# Patient Record
Sex: Male | Born: 1970 | Race: White | Hispanic: No | Marital: Married | Smoking: Never smoker
Health system: Southern US, Community
[De-identification: ages and names within clinical notes are randomized; demographics above are authoritative.]

## PROBLEM LIST (undated history)

## (undated) DIAGNOSIS — K120 Recurrent oral aphthae: Secondary | ICD-10-CM

## (undated) DIAGNOSIS — E7800 Pure hypercholesterolemia, unspecified: Secondary | ICD-10-CM

## (undated) DIAGNOSIS — K219 Gastro-esophageal reflux disease without esophagitis: Secondary | ICD-10-CM

## (undated) DIAGNOSIS — R7989 Other specified abnormal findings of blood chemistry: Secondary | ICD-10-CM

## (undated) HISTORY — DX: Pure hypercholesterolemia, unspecified: E78.00

## (undated) HISTORY — DX: Other specified abnormal findings of blood chemistry: R79.89

## (undated) HISTORY — PX: WISDOM TOOTH EXTRACTION: SHX21

---

## 2009-02-20 ENCOUNTER — Encounter: Admission: RE | Admit: 2009-02-20 | Discharge: 2009-02-20 | Payer: Self-pay | Admitting: Family Medicine

## 2010-03-17 ENCOUNTER — Encounter: Payer: Self-pay | Admitting: Urology

## 2011-12-19 ENCOUNTER — Other Ambulatory Visit: Payer: Self-pay | Admitting: Urology

## 2011-12-31 ENCOUNTER — Encounter (HOSPITAL_BASED_OUTPATIENT_CLINIC_OR_DEPARTMENT_OTHER): Payer: Self-pay | Admitting: *Deleted

## 2011-12-31 NOTE — Progress Notes (Signed)
NPO AFTER MN. ARRIVES AT 0930. NEEDS HG. 

## 2012-01-02 ENCOUNTER — Ambulatory Visit (HOSPITAL_BASED_OUTPATIENT_CLINIC_OR_DEPARTMENT_OTHER): Payer: PRIVATE HEALTH INSURANCE | Admitting: Anesthesiology

## 2012-01-02 ENCOUNTER — Encounter (HOSPITAL_BASED_OUTPATIENT_CLINIC_OR_DEPARTMENT_OTHER): Payer: Self-pay | Admitting: Anesthesiology

## 2012-01-02 ENCOUNTER — Ambulatory Visit (HOSPITAL_BASED_OUTPATIENT_CLINIC_OR_DEPARTMENT_OTHER)
Admission: RE | Admit: 2012-01-02 | Discharge: 2012-01-02 | Disposition: A | Discharge status: Home / Self Care | Payer: PRIVATE HEALTH INSURANCE | Source: Ambulatory Visit | Attending: Urology | Admitting: Urology

## 2012-01-02 ENCOUNTER — Encounter (HOSPITAL_BASED_OUTPATIENT_CLINIC_OR_DEPARTMENT_OTHER)
Admission: RE | Disposition: A | Discharge status: Home / Self Care | Payer: Self-pay | Source: Ambulatory Visit | Attending: Urology

## 2012-01-02 ENCOUNTER — Encounter (HOSPITAL_BASED_OUTPATIENT_CLINIC_OR_DEPARTMENT_OTHER): Payer: Self-pay

## 2012-01-02 DIAGNOSIS — Z302 Encounter for sterilization: Secondary | ICD-10-CM

## 2012-01-02 DIAGNOSIS — K219 Gastro-esophageal reflux disease without esophagitis: Secondary | ICD-10-CM | POA: Insufficient documentation

## 2012-01-02 HISTORY — DX: Recurrent oral aphthae: K12.0

## 2012-01-02 HISTORY — PX: VASECTOMY: SHX75

## 2012-01-02 HISTORY — DX: Gastro-esophageal reflux disease without esophagitis: K21.9

## 2012-01-02 SURGERY — VASECTOMY
Anesthesia: General | Site: Scrotum | Laterality: Bilateral | Wound class: Clean

## 2012-01-02 MED ORDER — KETOROLAC TROMETHAMINE 30 MG/ML IJ SOLN
INTRAMUSCULAR | Status: DC | PRN
  Administered 2012-01-02: 30 mg via INTRAVENOUS

## 2012-01-02 MED ORDER — PROPOFOL 10 MG/ML IV BOLUS
INTRAVENOUS | Status: DC | PRN
  Administered 2012-01-02: 300 mg via INTRAVENOUS

## 2012-01-02 MED ORDER — BUPIVACAINE HCL (PF) 0.25 % IJ SOLN
INTRAMUSCULAR | Status: DC | PRN
  Administered 2012-01-02: 10 mL

## 2012-01-02 MED ORDER — MIDAZOLAM HCL 5 MG/5ML IJ SOLN
INTRAMUSCULAR | Status: DC | PRN
  Administered 2012-01-02: 2 mg via INTRAVENOUS

## 2012-01-02 MED ORDER — TRAMADOL-ACETAMINOPHEN 37.5-325 MG PO TABS: 1.0000 | ORAL_TABLET | Freq: Four times a day (QID) | ORAL | Status: DC | PRN

## 2012-01-02 MED ORDER — PROMETHAZINE HCL 25 MG/ML IJ SOLN
6.2500 mg | INTRAMUSCULAR | Status: DC | PRN
  Filled 2012-01-02: qty 1

## 2012-01-02 MED ORDER — ONDANSETRON HCL 4 MG/2ML IJ SOLN
INTRAMUSCULAR | Status: DC | PRN
  Administered 2012-01-02: 4 mg via INTRAVENOUS

## 2012-01-02 MED ORDER — CEFAZOLIN SODIUM-DEXTROSE 2-3 GM-% IV SOLR
2.0000 g | INTRAVENOUS | Status: AC
  Administered 2012-01-02: 2 g via INTRAVENOUS
  Filled 2012-01-02: qty 50

## 2012-01-02 MED ORDER — LACTATED RINGERS IV SOLN
INTRAVENOUS | Status: DC
  Administered 2012-01-02 (×2): via INTRAVENOUS
  Filled 2012-01-02: qty 1000

## 2012-01-02 MED ORDER — LIDOCAINE HCL (CARDIAC) 20 MG/ML IV SOLN
INTRAVENOUS | Status: DC | PRN
  Administered 2012-01-02: 100 mg via INTRAVENOUS

## 2012-01-02 MED ORDER — FENTANYL CITRATE 0.05 MG/ML IJ SOLN
25.0000 ug | INTRAMUSCULAR | Status: DC | PRN
  Filled 2012-01-02: qty 1

## 2012-01-02 MED ORDER — DEXAMETHASONE SODIUM PHOSPHATE 4 MG/ML IJ SOLN
INTRAMUSCULAR | Status: DC | PRN
  Administered 2012-01-02: 10 mg via INTRAVENOUS

## 2012-01-02 MED ORDER — FENTANYL CITRATE 0.05 MG/ML IJ SOLN
INTRAMUSCULAR | Status: DC | PRN
  Administered 2012-01-02: 100 ug via INTRAVENOUS

## 2012-01-02 MED ORDER — CEFAZOLIN SODIUM 1-5 GM-% IV SOLN
1.0000 g | INTRAVENOUS | Status: DC
  Filled 2012-01-02: qty 50

## 2012-01-02 SURGICAL SUPPLY — 35 items
APPLICATOR COTTON TIP 6IN STRL (MISCELLANEOUS) IMPLANT
BANDAGE GAUZE ELAST BULKY 4 IN (GAUZE/BANDAGES/DRESSINGS) ×2 IMPLANT
BLADE SURG 15 STRL LF DISP TIS (BLADE) ×1 IMPLANT
BLADE SURG 15 STRL SS (BLADE) ×1
BLADE SURG ROTATE 9660 (MISCELLANEOUS) IMPLANT
CLEANER CAUTERY TIP 5X5 PAD (MISCELLANEOUS) ×1 IMPLANT
CLOTH BEACON ORANGE TIMEOUT ST (SAFETY) ×2 IMPLANT
COVER MAYO STAND STRL (DRAPES) ×2 IMPLANT
COVER TABLE BACK 60X90 (DRAPES) ×2 IMPLANT
DERMABOND ADVANCED (GAUZE/BANDAGES/DRESSINGS) ×1
DERMABOND ADVANCED .7 DNX12 (GAUZE/BANDAGES/DRESSINGS) ×1 IMPLANT
DRAPE PED LAPAROTOMY (DRAPES) ×2 IMPLANT
ELECT NEEDLE TIP 2.8 STRL (NEEDLE) ×2 IMPLANT
ELECT REM PT RETURN 9FT ADLT (ELECTROSURGICAL) ×2
ELECTRODE REM PT RTRN 9FT ADLT (ELECTROSURGICAL) ×1 IMPLANT
GLOVE BIO SURGEON STRL SZ7.5 (GLOVE) ×2 IMPLANT
GLOVE BIOGEL PI IND STRL 7.5 (GLOVE) ×1 IMPLANT
GLOVE BIOGEL PI INDICATOR 7.5 (GLOVE) ×1
GLOVE ECLIPSE 7.0 STRL STRAW (GLOVE) ×2 IMPLANT
GOWN PREVENTION PLUS XLARGE (GOWN DISPOSABLE) ×2 IMPLANT
GOWN STRL NON-REIN LRG LVL3 (GOWN DISPOSABLE) ×2 IMPLANT
GOWN W/2 COTTON TOWELS 2 STD (GOWNS) IMPLANT
NEEDLE HYPO 25X5/8 SAFETYGLIDE (NEEDLE) ×2 IMPLANT
NS IRRIG 500ML POUR BTL (IV SOLUTION) IMPLANT
PACK BASIN DAY SURGERY FS (CUSTOM PROCEDURE TRAY) ×2 IMPLANT
PAD CLEANER CAUTERY TIP 5X5 (MISCELLANEOUS) ×1
PENCIL BUTTON HOLSTER BLD 10FT (ELECTRODE) ×2 IMPLANT
SPONGE GAUZE 4X4 12PLY (GAUZE/BANDAGES/DRESSINGS) ×2 IMPLANT
SUT MON AB 5-0 PS2 18 (SUTURE) ×2 IMPLANT
SUT VIC AB 3-0 SH 27 (SUTURE) ×2
SUT VIC AB 3-0 SH 27X BRD (SUTURE) ×2 IMPLANT
SYR CONTROL 10ML LL (SYRINGE) ×2 IMPLANT
TOWEL OR 17X24 6PK STRL BLUE (TOWEL DISPOSABLE) ×4 IMPLANT
TRAY DSU PREP LF (CUSTOM PROCEDURE TRAY) ×2 IMPLANT
WATER STERILE IRR 500ML POUR (IV SOLUTION) IMPLANT

## 2012-01-02 NOTE — Op Note (Signed)
Pre-operative diagnosis : Elective sterilization  Postoperative diagnosis:Same  Operation:  Vasectomy  Surgeon:  S. Patsi Sears, MD  First assistant: None  Anesthesia:  epidural, general  Preparation:  After appropriate pre-anesthesia, the patient was brought to the OR and placed upon the OR table in the supine position, where General LMA anesthesia was administered. Arm band was double-checked. The scrotum was prepped with betadine solution, and draped in the usual fashion.   Review history:  41 yo male for vasectomy, with significant anxiety regarding office procedure, and tight scrotum on his physical exam, now for vasectomy under anesthesia.   Statement of  Likelihood of Success: Excellent. TIME-OUT observed.:  Procedure: Scrotal exam shows tight scrotum, even with anesthesia. The vas is identified bilaterally, however, and a 0.5cm incision made, and the vas isolated, and double-clamped. A portion of the vas was then removed using the electorsugical unit, and each end was ligated with 3-0 Vicryl suture.  The ends were oversewn, and the wounds closed with a single 3-0 Vicryl simple suture.  ).5 marcaine was injected into each wound, and the patient received IV Toradol. He was awakened and taken to the PAR in good condition.

## 2012-01-02 NOTE — H&P (Signed)
  Active Problems Problems  1. Inquiry And Counseling: Family Planning V25.09  History of Present Illness       41 yo married male, last seen 07/29/05, presents today for a vasectomy consult.  He is married to [place], and  has 1 son & 2 daughters.  He would like to consider a more permanent form of birth control.   Past Medical History Problems  1. History of  Anxiety (Symptom) 300.00 2. History of  Depression 311 3. History of  Esophageal Reflux 530.81  Surgical History Problems  1. History of  No Surgical Problems  Current Meds 1. Pantoprazole Sodium 40 MG Oral Tablet Delayed Release; Therapy: 10Sep2013 to  Allergies Medication  1. No Known Drug Allergies  Family History Problems  1. Paternal history of  Cancer 2. Family history of  Family Health Status - Father's Age 16. Family history of  Family Health Status - Mother's Age 10. Family history of  Family Health Status Number Of Children 1 son, 2 daughters 5. Paternal history of  Nephrolithiasis  Social History Problems    Alcohol Use 3 per week   Caffeine Use 1 per day   Marital History - Currently Married   Never A Smoker   Occupation: Pensions consultant  Review of Systems Genitourinary, constitutional, skin, eye, otolaryngeal, hematologic/lymphatic, cardiovascular, pulmonary, endocrine, musculoskeletal, gastrointestinal, neurological and psychiatric system(s) were reviewed and pertinent findings if present are noted.    Vitals Vital Signs [Data Includes: Last 1 Day]  08Oct2013 11:23AM  BMI Calculated: 24.64 BSA Calculated: 1.99 Height: 5 ft 11 in Weight: 176 lb  Blood Pressure: 160 / 97 Heart Rate: 62  Physical Exam Genitourinary: Examination of the penis demonstrates no discharge, no masses, no lesions and a normal meatus. The scrotum is without lesions. The right epididymis is palpably normal and non-tender. The left epididymis is palpably normal and non-tender. The right testis is non-tender and without masses.  The left testis is non-tender and without masses. Tight scrotum.    Assessment Assessed  1. Inquiry And Counseling: Family Planning V7.09   41 yo male for vasectomy. He will have his vas under anesthesia. We have discussed all his alternatives.   Plan Inquiry And Counseling: Family Planning (V25.09)  1. Follow-up PRN Office  Follow-up  Requested for: 08Oct2013   Discuss with his wife. Will call to schedule with general anesthesia.   Signatures Electronically signed by : Jethro Bolus, M.D.; Dec 02 2011 11:48AM

## 2012-01-02 NOTE — Interval H&P Note (Signed)
History and Physical Interval Note:  01/02/2012 10:39 AM  Frederick Herrera  has presented today for surgery, with the diagnosis of DESIRES STERILIZATION  The various methods of treatment have been discussed with the patient and family. After consideration of risks, benefits and other options for treatment, the patient has consented to  Procedure(s) (LRB) with comments: VASECTOMY (Bilateral) as a surgical intervention .  The patient's history has been reviewed, patient examined, no change in status, stable for surgery.  I have reviewed the patient's chart and labs.  Questions were answered to the patient's satisfaction.     Jethro Bolus I

## 2012-01-02 NOTE — Anesthesia Preprocedure Evaluation (Signed)
Anesthesia Evaluation  Patient identified by MRN, date of birth, ID band Patient awake    Reviewed: Allergy & Precautions, H&P , NPO status , Patient's Chart, lab work & pertinent test results, reviewed documented beta blocker date and time   Airway Mallampati: II TM Distance: >3 FB Neck ROM: full    Dental No notable dental hx.    Pulmonary neg pulmonary ROS,  breath sounds clear to auscultation  Pulmonary exam normal       Cardiovascular Exercise Tolerance: Good negative cardio ROS  Rhythm:regular Rate:Normal     Neuro/Psych Depression and anxiety.negative neurological ROS     GI/Hepatic Neg liver ROS, GERD-  Medicated,  Endo/Other  negative endocrine ROS  Renal/GU negative Renal ROS  negative genitourinary   Musculoskeletal   Abdominal   Peds  Hematology negative hematology ROS (+)   Anesthesia Other Findings   Reproductive/Obstetrics negative OB ROS                           Anesthesia Physical Anesthesia Plan  ASA: I  Anesthesia Plan: General LMA   Post-op Pain Management:    Induction:   Airway Management Planned:   Additional Equipment:   Intra-op Plan:   Post-operative Plan:   Informed Consent: I have reviewed the patients History and Physical, chart, labs and discussed the procedure including the risks, benefits and alternatives for the proposed anesthesia with the patient or authorized representative who has indicated his/her understanding and acceptance.   Dental Advisory Given  Plan Discussed with: CRNA  Anesthesia Plan Comments:         Anesthesia Quick Evaluation

## 2012-01-02 NOTE — Anesthesia Postprocedure Evaluation (Signed)
  Anesthesia Post-op Note  Patient: Frederick Herrera  Procedure(s) Performed: Procedure(s) (LRB): VASECTOMY (Bilateral)  Patient Location: PACU  Anesthesia Type: General  Level of Consciousness: awake and alert   Airway and Oxygen Therapy: Patient Spontanous Breathing  Post-op Pain: mild  Post-op Assessment: Post-op Vital signs reviewed, Patient's Cardiovascular Status Stable, Respiratory Function Stable, Patent Airway and No signs of Nausea or vomiting  Post-op Vital Signs: stable  Complications: No apparent anesthesia complications

## 2012-01-02 NOTE — Transfer of Care (Signed)
Immediate Anesthesia Transfer of Care Note  Patient: Frederick Herrera  Procedure(s) Performed: Procedure(s) (LRB): VASECTOMY (Bilateral)  Patient Location: PACU  Anesthesia Type: General  Level of Consciousness: awake, oriented, sedated and patient cooperative  Airway & Oxygen Therapy: Patient Spontanous Breathing and Patient connected to face mask oxygen  Post-op Assessment: Report given to PACU RN and Post -op Vital signs reviewed and stable  Post vital signs: Reviewed and stable  Complications: No apparent anesthesia complications

## 2012-01-02 NOTE — Anesthesia Procedure Notes (Signed)
Procedure Name: LMA Insertion Date/Time: 01/02/2012 10:44 AM Performed by: Norva Pavlov Pre-anesthesia Checklist: Patient identified, Emergency Drugs available, Suction available and Patient being monitored Patient Re-evaluated:Patient Re-evaluated prior to inductionOxygen Delivery Method: Circle System Utilized Preoxygenation: Pre-oxygenation with 100% oxygen Intubation Type: IV induction Ventilation: Mask ventilation without difficulty LMA: LMA inserted LMA Size: 4.0 Number of attempts: 1 Airway Equipment and Method: bite block Placement Confirmation: positive ETCO2 Tube secured with: Tape Dental Injury: Teeth and Oropharynx as per pre-operative assessment

## 2012-01-06 ENCOUNTER — Encounter (HOSPITAL_BASED_OUTPATIENT_CLINIC_OR_DEPARTMENT_OTHER): Payer: Self-pay | Admitting: Urology

## 2012-02-05 ENCOUNTER — Encounter (HOSPITAL_COMMUNITY): Payer: Self-pay | Admitting: Emergency Medicine

## 2012-02-05 ENCOUNTER — Emergency Department (HOSPITAL_COMMUNITY)
Admission: EM | Admit: 2012-02-05 | Discharge: 2012-02-05 | Disposition: A | Discharge status: Home / Self Care | Payer: PRIVATE HEALTH INSURANCE | Attending: Emergency Medicine | Admitting: Emergency Medicine

## 2012-02-05 DIAGNOSIS — R509 Fever, unspecified: Secondary | ICD-10-CM | POA: Insufficient documentation

## 2012-02-05 DIAGNOSIS — K219 Gastro-esophageal reflux disease without esophagitis: Secondary | ICD-10-CM | POA: Insufficient documentation

## 2012-02-05 DIAGNOSIS — Z8719 Personal history of other diseases of the digestive system: Secondary | ICD-10-CM | POA: Insufficient documentation

## 2012-02-05 DIAGNOSIS — Z79899 Other long term (current) drug therapy: Secondary | ICD-10-CM | POA: Insufficient documentation

## 2012-02-05 DIAGNOSIS — M542 Cervicalgia: Secondary | ICD-10-CM | POA: Insufficient documentation

## 2012-02-05 DIAGNOSIS — R51 Headache: Secondary | ICD-10-CM | POA: Insufficient documentation

## 2012-02-05 DIAGNOSIS — IMO0001 Reserved for inherently not codable concepts without codable children: Secondary | ICD-10-CM | POA: Insufficient documentation

## 2012-02-05 LAB — CBC WITH DIFFERENTIAL/PLATELET
Basophils Absolute: 0.1 10*3/uL (ref 0.0–0.1)
Basophils Relative: 1 % (ref 0–1)
Eosinophils Absolute: 0.1 10*3/uL (ref 0.0–0.7)
Eosinophils Relative: 1 % (ref 0–5)
HCT: 39.4 % (ref 39.0–52.0)
Hemoglobin: 13.9 g/dL (ref 13.0–17.0)
Lymphocytes Relative: 32 % (ref 12–46)
Lymphs Abs: 2.1 10*3/uL (ref 0.7–4.0)
MCH: 29.7 pg (ref 26.0–34.0)
MCHC: 35.3 g/dL (ref 30.0–36.0)
MCV: 84.2 fL (ref 78.0–100.0)
Monocytes Absolute: 0.4 10*3/uL (ref 0.1–1.0)
Monocytes Relative: 6 % (ref 3–12)
Neutro Abs: 3.8 10*3/uL (ref 1.7–7.7)
Neutrophils Relative %: 60 % (ref 43–77)
Platelets: 252 10*3/uL (ref 150–400)
RBC: 4.68 MIL/uL (ref 4.22–5.81)
RDW: 12.7 % (ref 11.5–15.5)
WBC: 6.4 10*3/uL (ref 4.0–10.5)

## 2012-02-05 LAB — BASIC METABOLIC PANEL
BUN: 8 mg/dL (ref 6–23)
CO2: 28 mEq/L (ref 19–32)
Calcium: 9.4 mg/dL (ref 8.4–10.5)
Chloride: 101 mEq/L (ref 96–112)
Creatinine, Ser: 0.68 mg/dL (ref 0.50–1.35)
GFR calc Af Amer: >90 mL/min (ref >90)
GFR calc non Af Amer: >90 mL/min (ref >90)
Glucose, Bld: 100 mg/dL — ABNORMAL HIGH (ref 70–99)
Potassium: 4 mEq/L (ref 3.5–5.1)
Sodium: 140 mEq/L (ref 135–145)

## 2012-02-05 NOTE — ED Notes (Signed)
Pt c/o flu like illness with fever that is mostly resolved but still having HA and neck pain; pt sts taking tylenol and motrin for fever; pt denies photophobia or sweating at present; pt sent here by PCP to r/o meningitis

## 2012-02-05 NOTE — ED Provider Notes (Signed)
History     CSN: [id number]  Arrival date & time 02/05/12  1321   First MD Initiated Contact with Patient 02/05/12 1620      Chief Complaint  Patient presents with  . Headache  . Generalized Body Aches  . Neck Pain    (Consider location/radiation/quality/duration/timing/severity/associated sxs/prior treatment) Patient is a 41 y.o. male presenting with headaches and neck pain. The history is provided by the patient and the spouse.  Headache  This is a new problem. The current episode started more than 2 days ago. The problem occurs every few hours. The problem has been gradually improving. The headache is associated with nothing. Pain location: Diffuse. The quality of the pain is described as dull and throbbing. The pain is moderate. The pain does not radiate. Associated symptoms include a fever. Pertinent negatives include no anorexia, no malaise/fatigue, no shortness of breath, no nausea and no vomiting. Associated symptoms comments: Myalgias for duration of the illness, left-sided neck pain that began today.Marland Kitchen He has tried acetaminophen and NSAIDs for the symptoms. The treatment provided moderate relief.  Neck Pain  Associated symptoms include headaches. Pertinent negatives include no chest pain.    Past Medical History  Diagnosis Date  . GERD (gastroesophageal reflux disease)   . 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 Wisdom tooth extraction AGE 43    ORAL SURGEON OFFICE (POST N/V)  . Vasectomy 01/02/2012    Procedure: VASECTOMY;  Surgeon: Kathi Ludwig, MD;  Location: [facility];  Service: Urology;  Laterality: Bilateral;    History reviewed. No pertinent family history.  History  Substance Use Topics  . Smoking status: Never Smoker   . Smokeless tobacco: Never Used  . Alcohol Use: Yes     Comment: OCCASIONAL      Review of Systems  Constitutional: Positive for fever. Negative for chills and  malaise/fatigue.  HENT: Positive for neck pain. Negative for sneezing.   Respiratory: Negative for cough and shortness of breath.   Cardiovascular: Negative for chest pain and leg swelling.  Gastrointestinal: Negative for nausea, vomiting and anorexia.  Neurological: Positive for headaches.  All other systems reviewed and are negative.    Allergies  Review of patient's allergies indicates no known allergies.  Home Medications   Current Outpatient Rx  Name  Route  Sig  Dispense  Refill  . ACETAMINOPHEN 500 MG PO TABS   Oral   Take 500 mg by mouth every 6 (six) hours as needed. For pain         . IBUPROFEN 200 MG PO TABS   Oral   Take 800 mg by mouth every 6 (six) hours as needed. For pain         . PANTOPRAZOLE SODIUM 40 MG PO TBEC   Oral   Take 40 mg by mouth every evening.           BP 159/97  Pulse 75  Temp 98.8 F (37.1 C) (Oral)  Resp 18  SpO2 98%  Physical Exam  Nursing note and vitals reviewed. Constitutional: He is oriented to person, place, and time. He appears well-developed and well-nourished. No distress.  HENT:  Head: Normocephalic and atraumatic.  Mouth/Throat: No oropharyngeal exudate.  Eyes: EOM are normal. Pupils are equal, round, and reactive to light.  Neck: Normal range of motion. Neck supple. No tracheal deviation present. No thyromegaly present.       No meningeal signs  Cardiovascular: Normal rate and regular rhythm.  Exam reveals no friction rub.   No murmur heard. Pulmonary/Chest: Effort normal and breath sounds normal. No respiratory distress. He has no wheezes. He has no rales.  Abdominal: He exhibits no distension. There is no tenderness. There is no rebound.  Musculoskeletal: Normal range of motion. He exhibits no edema.  Lymphadenopathy:    He has no cervical adenopathy.  Neurological: He is alert and oriented to person, place, and time. No cranial nerve deficit. He exhibits normal muscle tone. Coordination normal.  Skin: He is  not diaphoretic.    ED Course  Procedures (including critical care time)  Labs Reviewed  BASIC METABOLIC PANEL - Abnormal; Notable for the following:    Glucose, Bld 100 (*)     All other components within normal limits  CBC WITH DIFFERENTIAL   No results found.   1. Headache   2. Fever       MDM  Patient is a 41 year old male with history of GERD who presents with headache and neck pain. He has had headaches for the past 6 days with associated fevers. Neck pain started earlier today. The patient presented to his PCP earlier in the week he recommended supportive care with NSAIDs and Tylenol for his fever and headache. He call his physician today and reported neck pain. The physician recommended to come here for a rule out of meningitis. Patient states his neck pain is on the left side it feels like a pulled muscle. Patient easily ambulatory to the department. He is nontoxic appearing. He is afebrile and without headache at this time. He states he's been using Motrin and Tylenol around-the-clock for fever suppression for headache control. Patient has no signs of meningismus. He is moving all extremities well and has no cranial nerve deficits. My clinical suspicion for meningitis is very low. His symptoms are improving. His symptoms of the present for 6 days. He also appears her well and is not appear to clinically have meningitis. Dr. Juleen China and I spent some time counseling the patient and offered an LP is feeling definitive means of ruling out meningitis. The patient declined an LP at this time. Labs are normal. Patient stable for discharge.    Elwin Mocha, MD 02/05/12 [phone]

## 2012-02-05 NOTE — ED Provider Notes (Signed)
I saw and evaluated the patient, reviewed the resident's note and I agree with the findings and plan.  41 year old male with persistent headache and neck pain. Onset about 6 days ago. Flulike symptoms initially which have improved although the headache and neck pain has persisted. No neurological complaints. On exam patient is well appearing. There is no nuchal rigidity. Cranial nerves II through XII are intact. Pupils are round and equally reactive to light. Strength is 5 out of 5 bilateral upper lower extremities. Speech clear and content is appropriate. Good finger nose testing bilaterally. Very low suspicion for meningitis or other potential emergent etiology of his symptoms such as bleed, heart or vertebral artery dissection, carbon monoxide poisoning or ocular etiology. Headache and neck pain may be lingering symptoms from his recent illness. Discussed with patient's significant other benefits of lumbar puncture. He is deferring. I feel this is reasonable. Emergent return cautions were discussed. Plan continued symptomatic treatment otherwise.  Raeford Razor, MD 02/05/12 1840

## 2015-10-30 DIAGNOSIS — D2262 Melanocytic nevi of left upper limb, including shoulder: Secondary | ICD-10-CM | POA: Diagnosis not present

## 2015-10-30 DIAGNOSIS — L821 Other seborrheic keratosis: Secondary | ICD-10-CM | POA: Diagnosis not present

## 2015-10-30 DIAGNOSIS — D2222 Melanocytic nevi of left ear and external auricular canal: Secondary | ICD-10-CM | POA: Diagnosis not present

## 2015-10-30 DIAGNOSIS — D224 Melanocytic nevi of scalp and neck: Secondary | ICD-10-CM | POA: Diagnosis not present

## 2015-11-20 DIAGNOSIS — Z23 Encounter for immunization: Secondary | ICD-10-CM | POA: Diagnosis not present

## 2015-11-27 DIAGNOSIS — Z0100 Encounter for examination of eyes and vision without abnormal findings: Secondary | ICD-10-CM | POA: Diagnosis not present

## 2016-01-09 DIAGNOSIS — R55 Syncope and collapse: Secondary | ICD-10-CM | POA: Diagnosis not present

## 2016-01-15 DIAGNOSIS — R42 Dizziness and giddiness: Secondary | ICD-10-CM | POA: Diagnosis not present

## 2016-01-15 DIAGNOSIS — R030 Elevated blood-pressure reading, without diagnosis of hypertension: Secondary | ICD-10-CM | POA: Diagnosis not present

## 2016-04-01 DIAGNOSIS — R55 Syncope and collapse: Secondary | ICD-10-CM | POA: Diagnosis not present

## 2016-04-01 DIAGNOSIS — R42 Dizziness and giddiness: Secondary | ICD-10-CM | POA: Diagnosis not present

## 2016-04-01 DIAGNOSIS — I10 Essential (primary) hypertension: Secondary | ICD-10-CM | POA: Diagnosis not present

## 2016-04-07 ENCOUNTER — Telehealth: Payer: Self-pay

## 2016-04-07 NOTE — Telephone Encounter (Signed)
SENT NOTES TO SCHEDULING 

## 2016-04-09 DIAGNOSIS — R42 Dizziness and giddiness: Secondary | ICD-10-CM | POA: Diagnosis not present

## 2016-04-11 ENCOUNTER — Encounter: Payer: Self-pay | Admitting: Internal Medicine

## 2016-04-11 ENCOUNTER — Other Ambulatory Visit: Payer: Self-pay

## 2016-04-11 ENCOUNTER — Ambulatory Visit (INDEPENDENT_AMBULATORY_CARE_PROVIDER_SITE_OTHER): Payer: BLUE CROSS/BLUE SHIELD | Admitting: Internal Medicine

## 2016-04-11 VITALS — BP 142/90 | HR 70 | Ht 71.0 in | Wt 177.2 lb

## 2016-04-11 DIAGNOSIS — R55 Syncope and collapse: Secondary | ICD-10-CM

## 2016-04-11 DIAGNOSIS — R42 Dizziness and giddiness: Secondary | ICD-10-CM

## 2016-04-11 NOTE — Patient Instructions (Addendum)
Medication Instructions:  Your physician recommends that you continue on your current medications as directed. Please refer to the Current Medication list given to you today.   Labwork: None Ordered    Testing/Procedures: None Ordered   Follow-Up: Your physician recommends that you schedule a follow-up appointment in: 6 weeks with Dr. Ladona Ridgelaylor   Any Other Special Instructions Will Be Listed Below (If Applicable). Obtain and record blood pressure at least 2 times a week. Bring recordings to next visit.     If you need a refill on your cardiac medications before your next appointment, please call your pharmacy.

## 2016-04-11 NOTE — Progress Notes (Signed)
HPI [name] is referred today for evaluation of dizzy spells and one episode of syncope. He is an otherwise healthy man who passed about 3 months ago while using the bathroom. He got dizzy, stood up and passed out. He did not lose continence and did not bite his tongue. He has had additional episodes of dizzy. He is not sure if they are related. 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 Eventually the episode stopped after he left the large room where he was speaking. He has never lost continence. He notes that he has recently increased his caffeine intake. His blood pressure has also gone up. He does not have chest pain or sob or edema. No change in physical activity. Interestingly, as a child, he had several episodes of frank syncope and his daughter has had 2 episodes of syncope. No Known Allergies   Current Outpatient Prescriptions  Medication Sig Dispense Refill  . pantoprazole (PROTONIX) 20 MG tablet Take 20 mg by mouth daily.  2  . acetaminophen (TYLENOL) 500 MG tablet Take 1,000 mg by mouth every 6 (six) hours as needed. For pain     . ALPRAZolam (XANAX) 0.25 MG tablet Take 0.25 mg by mouth as directed. As needed    . bismuth subsalicylate (PEPTO BISMOL) 262 MG chewable tablet as directed. Take 2 tablets by mouth as needed    . cetirizine (ZYRTEC) 10 MG tablet Take 10 mg by mouth daily as needed for allergies.    Marland Kitchen. ibuprofen (ADVIL,MOTRIN) 200 MG tablet Take 200 mg by mouth every 6 (six) hours as needed. For pain    . loperamide (IMODIUM) 2 MG capsule Take 2 mg by mouth as directed. As needed for diarrhea or loose stools    . meclizine (ANTIVERT) 25 MG tablet Take 1 tablet by mouth 2 (two) times daily as needed for dizziness.  0   No current facility-administered medications for this visit.      Past Medical History:  Diagnosis Date  . Elevated cholesterol    MILD  . GERD (gastroesophageal reflux disease)   . Low  testosterone level in male    resolved  . 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   Past Surgical History:  Procedure Laterality Date  . VASECTOMY  01/02/2012   Procedure: VASECTOMY;  Surgeon: Frederick LudwigSigmund I Tannenbaum, MD;  [facility];  Service: Urology;  Laterality: Bilateral;  . WISDOM TOOTH EXTRACTION  AGE 35   ORAL SURGEON OFFICE (POST N/V)     No family history on file.   Social History   Social History  . Marital status: Married    Spouse name: N/A  . Number of children: N/A  . Years of education: N/A   Occupational History  . Not on file.   Social History Main Topics  . Smoking status: Never Smoker  . Smokeless tobacco: Never Used  . Alcohol use Yes     Comment: OCCASIONAL  . Drug use: No  . Sexual activity: Not on file   Other Topics Concern  . Not on file   Social History Narrative  . No narrative on file     BP (!) 142/90   Pulse 70   Ht 5\' 11"  (1.803 m)   Wt 177 lb 3.2 oz (80.4 kg)   BMI  24.71 kg/m  Orthostatic vitals - no drop in blood pressure. HR increased from 66 to 80/min.  Physical Exam:  Well appearing 46 yo man, NAD HEENT: Unremarkable Neck:  6 cm JVD, no thyromegally Lymphatics:  No adenopathy Back:  No CVA tenderness Lungs:  Clear with no wheezes HEART:  Regular rate rhythm, no murmurs, no rubs, no clicks Abd:  soft, positive bowel sounds, no organomegally, no rebound, no guarding Ext:  2 plus pulses, no edema, no cyanosis, no clubbing Skin:  No rashes no nodules Neuro:  CN II through XII intact, motor grossly intact  EKG - nsr   DEVICE  Normal device function.  See PaceArt for details.   Assess/Plan: 1. Autonomic dysfunction - he has had a single episode of syncope many months ago. None since although he has episodes of dizziness. He is pending a neuro visit. I have discussed the pathophysiology of autonomic dysfunction.  Because of HTN, I have not recommended increasing his salt intake. I might have him try a low dose beta blocker when I see him again, depending on how he has done. Warnings to lie down if he feels a bad spell were given.  2. HTN - this is a new problem and complicates his treatment. I will consider adding a beta blocker when he returns if his BP is still up.  Frederick Herrera,M.D.  Spent over 60 minutes including 30 minutes of face-face time with the patient.

## 2016-04-14 DIAGNOSIS — R51 Headache: Secondary | ICD-10-CM | POA: Diagnosis not present

## 2016-04-14 DIAGNOSIS — R42 Dizziness and giddiness: Secondary | ICD-10-CM | POA: Diagnosis not present

## 2016-04-14 DIAGNOSIS — G4489 Other headache syndrome: Secondary | ICD-10-CM | POA: Diagnosis not present

## 2016-04-14 DIAGNOSIS — R55 Syncope and collapse: Secondary | ICD-10-CM | POA: Diagnosis not present

## 2016-04-16 ENCOUNTER — Ambulatory Visit: Payer: PRIVATE HEALTH INSURANCE | Admitting: Cardiology

## 2016-04-23 ENCOUNTER — Other Ambulatory Visit: Payer: Self-pay | Admitting: Neurology

## 2016-04-23 DIAGNOSIS — R42 Dizziness and giddiness: Secondary | ICD-10-CM

## 2016-04-25 ENCOUNTER — Ambulatory Visit
Admission: RE | Admit: 2016-04-25 | Discharge: 2016-04-25 | Disposition: A | Discharge status: Home / Self Care | Payer: BLUE CROSS/BLUE SHIELD | Source: Ambulatory Visit | Attending: Neurology | Admitting: Neurology

## 2016-04-25 DIAGNOSIS — R42 Dizziness and giddiness: Secondary | ICD-10-CM

## 2016-04-25 DIAGNOSIS — R51 Headache: Secondary | ICD-10-CM | POA: Diagnosis not present

## 2016-04-29 DIAGNOSIS — F411 Generalized anxiety disorder: Secondary | ICD-10-CM | POA: Diagnosis not present

## 2016-05-06 DIAGNOSIS — H0011 Chalazion right upper eyelid: Secondary | ICD-10-CM | POA: Diagnosis not present

## 2016-05-20 DIAGNOSIS — H0011 Chalazion right upper eyelid: Secondary | ICD-10-CM | POA: Diagnosis not present

## 2016-05-21 ENCOUNTER — Ambulatory Visit: Payer: BLUE CROSS/BLUE SHIELD | Admitting: Internal Medicine

## 2016-06-16 DIAGNOSIS — G44209 Tension-type headache, unspecified, not intractable: Secondary | ICD-10-CM | POA: Diagnosis not present

## 2016-06-16 DIAGNOSIS — I10 Essential (primary) hypertension: Secondary | ICD-10-CM | POA: Diagnosis not present

## 2016-06-16 DIAGNOSIS — F411 Generalized anxiety disorder: Secondary | ICD-10-CM | POA: Diagnosis not present

## 2016-09-04 DIAGNOSIS — L247 Irritant contact dermatitis due to plants, except food: Secondary | ICD-10-CM | POA: Diagnosis not present

## 2016-12-01 DIAGNOSIS — Z23 Encounter for immunization: Secondary | ICD-10-CM | POA: Diagnosis not present

## 2017-01-27 DIAGNOSIS — K219 Gastro-esophageal reflux disease without esophagitis: Secondary | ICD-10-CM | POA: Diagnosis not present

## 2017-01-27 DIAGNOSIS — F411 Generalized anxiety disorder: Secondary | ICD-10-CM | POA: Diagnosis not present

## 2017-01-27 DIAGNOSIS — Z0000 Encounter for general adult medical examination without abnormal findings: Secondary | ICD-10-CM | POA: Diagnosis not present

## 2017-01-29 DIAGNOSIS — Z0000 Encounter for general adult medical examination without abnormal findings: Secondary | ICD-10-CM | POA: Diagnosis not present

## 2017-01-29 DIAGNOSIS — Z13220 Encounter for screening for lipoid disorders: Secondary | ICD-10-CM | POA: Diagnosis not present

## 2017-06-24 DIAGNOSIS — M7712 Lateral epicondylitis, left elbow: Secondary | ICD-10-CM | POA: Diagnosis not present

## 2017-06-24 DIAGNOSIS — H6501 Acute serous otitis media, right ear: Secondary | ICD-10-CM | POA: Diagnosis not present

## 2017-07-06 DIAGNOSIS — M542 Cervicalgia: Secondary | ICD-10-CM | POA: Diagnosis not present

## 2017-11-18 IMAGING — MR MR HEAD W/O CM
8 series · 48 of 48 positions shown · non-contrast
Comparison: None.

CLINICAL DATA: Dizziness and headache

EXAM:
MRI HEAD WITHOUT CONTRAST
TECHNIQUE: Multiplanar, multiecho pulse sequences of the brain and surrounding
structures were obtained without intravenous contrast.

[Series 2: T1 · sagittal · 5.0mm · 0.45mm/px · 2 of 21 slices shown]
[im 1/21]
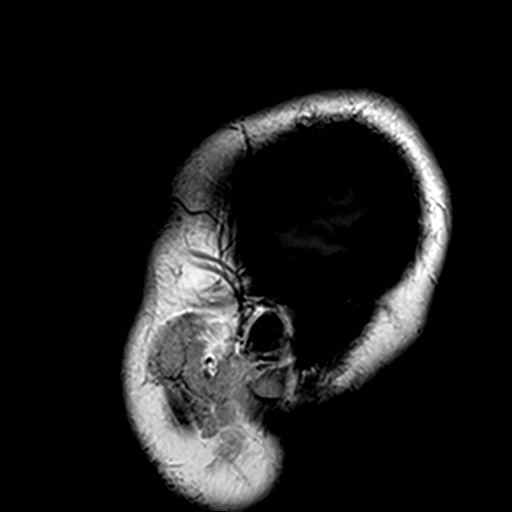
[im 21/21]
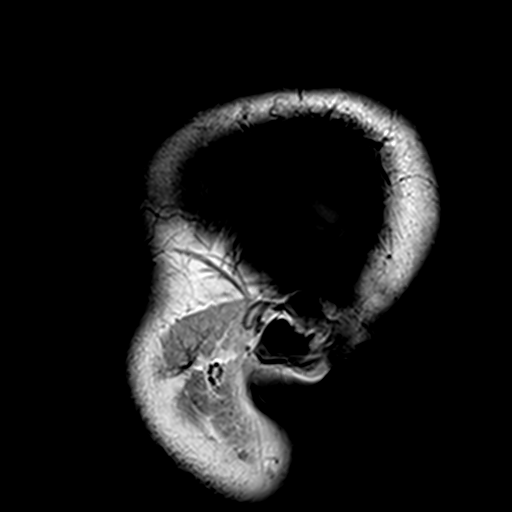

[Series 3: DWI · axial · 3.0mm · 1.80mm/px · z∈[+32,+179]mm · 10 of 100 slices shown (1 of 2)]
[im 1/100]
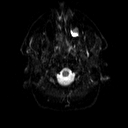
[im 12/100]
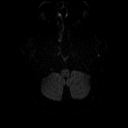
[im 23/100]
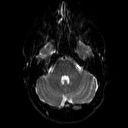
[im 34/100]
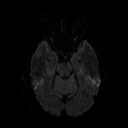
[im 45/100]
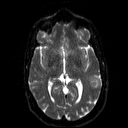
[im 56/100]
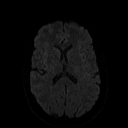
[im 67/100]
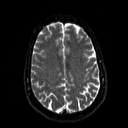
[im 78/100]
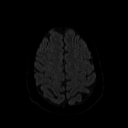
[im 89/100]
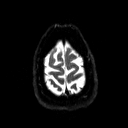
[im 100/100]
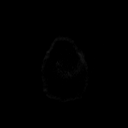

[Series 4: DWI · axial · 3.0mm · 1.80mm/px · z∈[+32,+179]mm · 4 of 46 slices shown (2 of 2)]
[im 1/46]
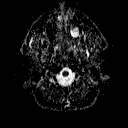
[im 16/46]
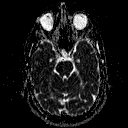
[im 31/46]
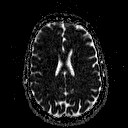
[im 46/46]
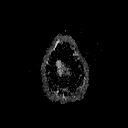

[Series 5: T2 · axial · 5.0mm · 0.51mm/px · z∈[+27,+182]mm · 2 of 24 slices shown (1 of 2)]
[im 1/24]
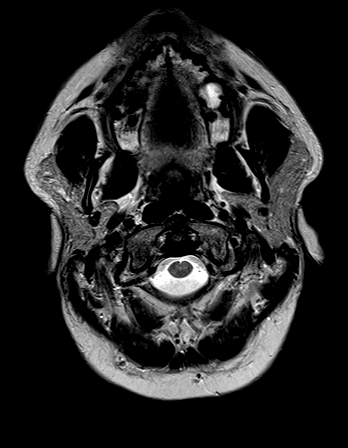
[im 24/24]
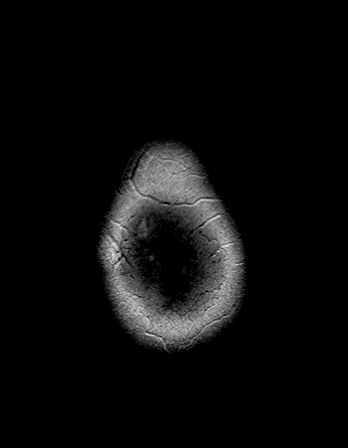

[Series 6: FLAIR · axial · 3.0mm · 0.45mm/px · z∈[+33,+180]mm · 5 of 50 slices shown]
[im 1/50]
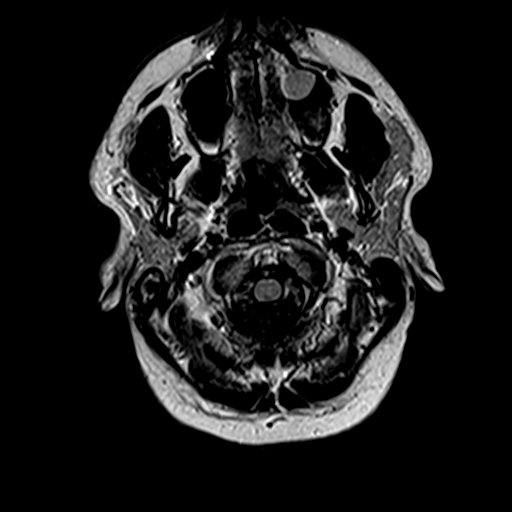
[im 13/50]
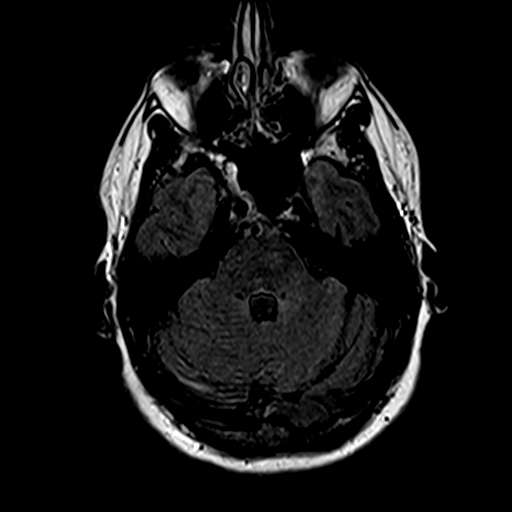
[im 25/50]
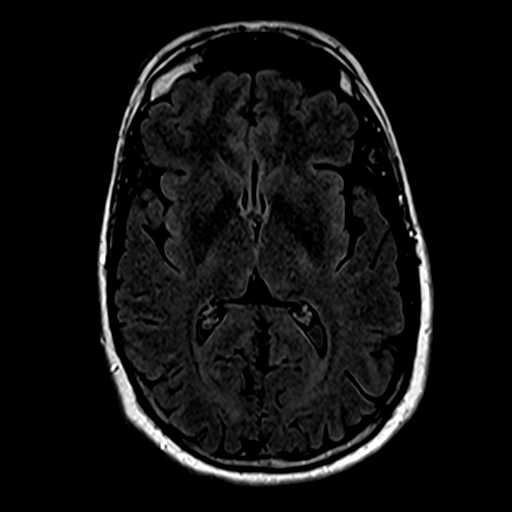
[im 37/50]
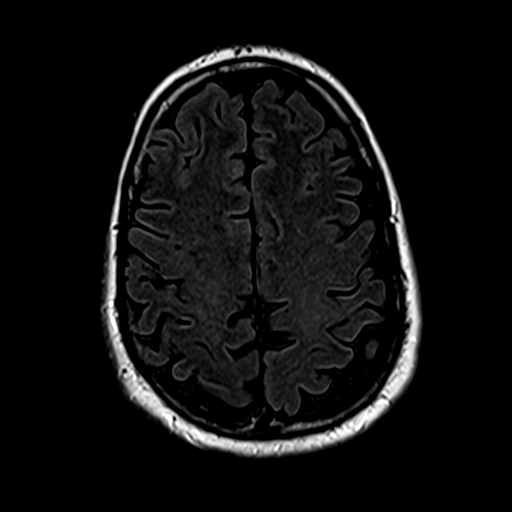
[im 50/50]
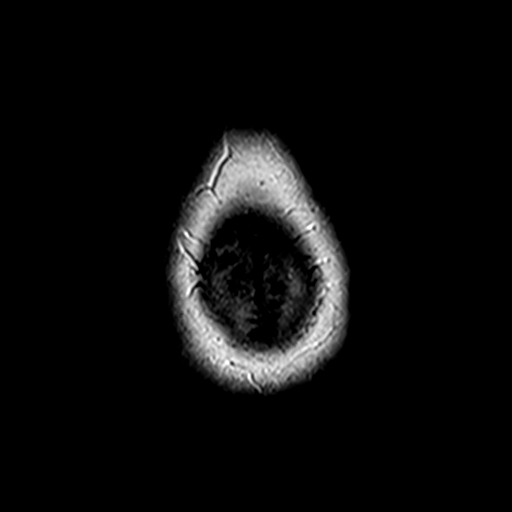

[Series 8: swi_images · axial · 2.0mm · 0.90mm/px · z∈[+20,+178]mm · 8 of 80 slices shown]
[im 1/80]
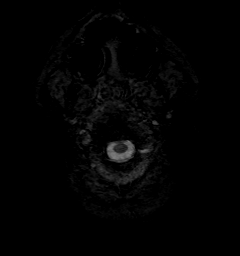
[im 12/80]
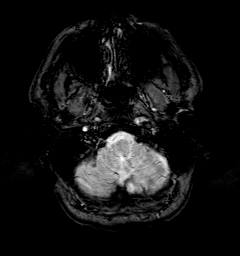
[im 23/80]
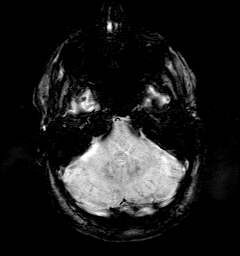
[im 34/80]
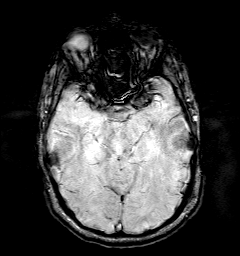
[im 46/80]
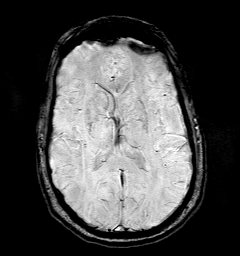
[im 57/80]
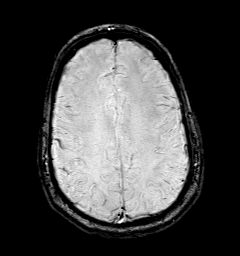
[im 68/80]
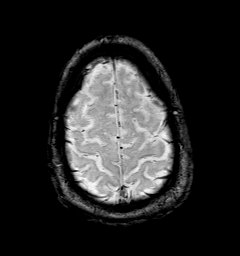
[im 80/80]
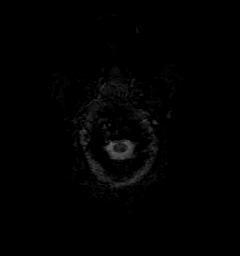

[Series 9: t1_mpr_tra · axial · 1.1mm · 0.75mm/px · z∈[+25,+178]mm · 14 of 144 slices shown]
[im 1/144]
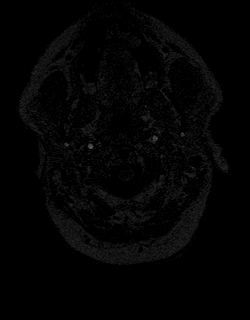
[im 12/144]
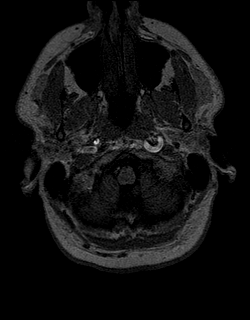
[im 23/144]
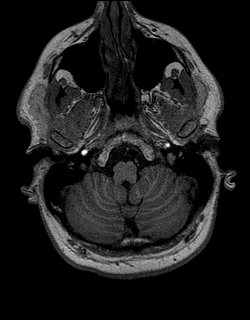
[im 34/144]
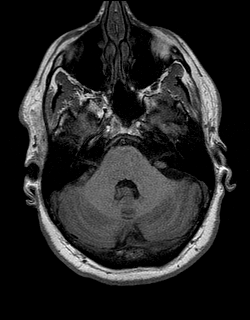
[im 45/144]
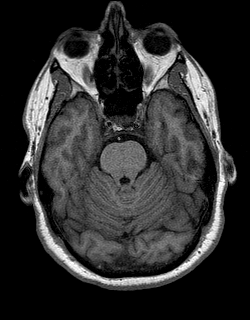
[im 56/144]
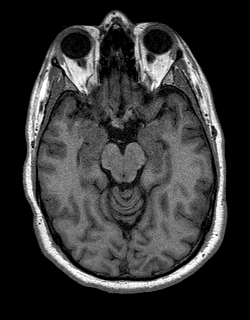
[im 67/144]
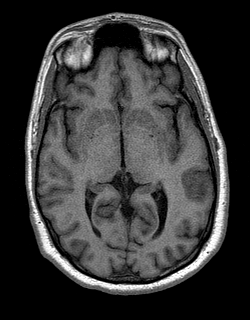
[im 78/144]
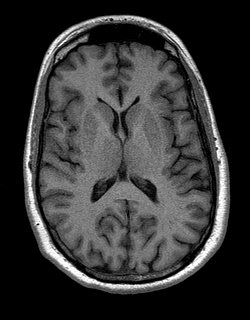
[im 89/144]
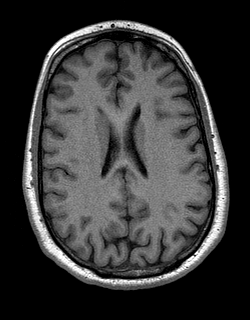
[im 100/144]
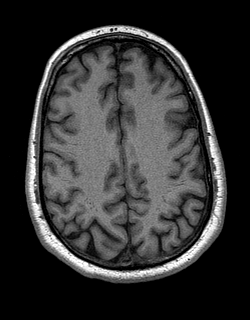
[im 111/144]
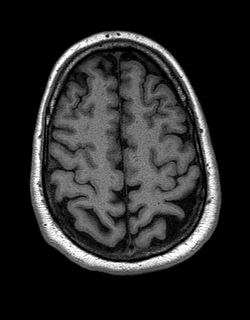
[im 122/144]
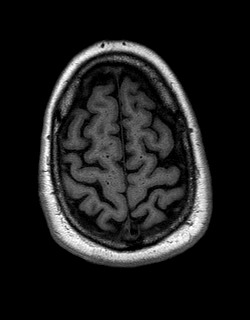
[im 133/144]
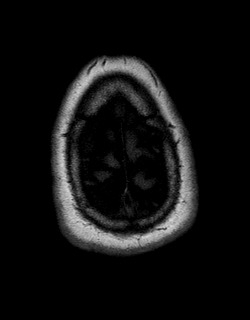
[im 144/144]
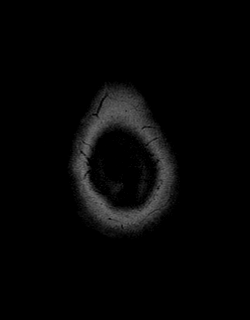

[Series 10: T2 · coronal · 5.0mm · 0.45mm/px · 3 of 30 slices shown (2 of 2)]
[im 1/30]
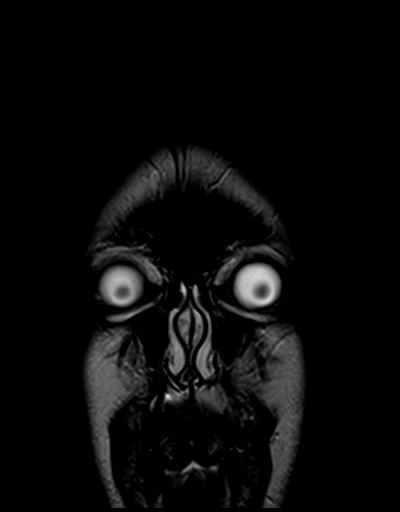
[im 15/30]
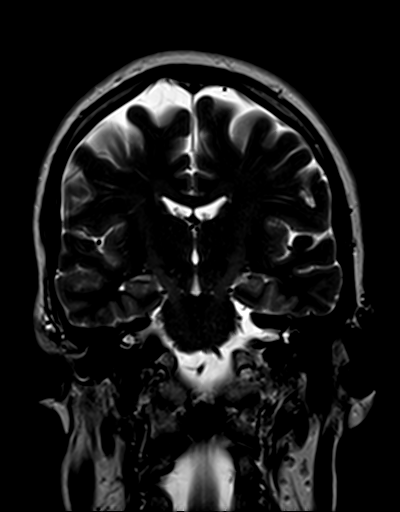
[im 30/30]
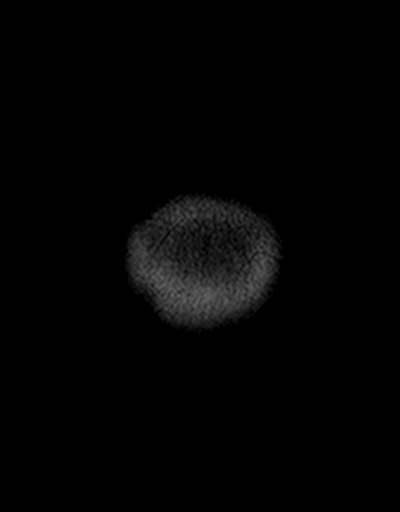

[48 of 48 positions shown; findings below may reference images not displayed]

FINDINGS: Brain: Ventricle size and cerebral volume normal. Negative for acute
or chronic infarction. Negative for demyelinating disease. Negative
for hemorrhage or mass. No edema or shift of the midline structures.

Vascular: Normal arterial flow voids.

Skull and upper cervical spine: Negative

Sinuses/Orbits: Mucous retention cyst left maxillary sinus. Mild
mucosal edema in the ethmoid sinuses. Negative orbit.

Other: None
IMPRESSION: Negative MRI of the brain without contrast.

Mild mucosal edema disease in the paranasal sinuses.

## 2017-12-01 DIAGNOSIS — Z23 Encounter for immunization: Secondary | ICD-10-CM | POA: Diagnosis not present

## 2018-02-09 DIAGNOSIS — Z0000 Encounter for general adult medical examination without abnormal findings: Secondary | ICD-10-CM | POA: Diagnosis not present

## 2018-02-09 DIAGNOSIS — I10 Essential (primary) hypertension: Secondary | ICD-10-CM | POA: Diagnosis not present

## 2018-02-18 DIAGNOSIS — K219 Gastro-esophageal reflux disease without esophagitis: Secondary | ICD-10-CM | POA: Diagnosis not present

## 2018-04-01 DIAGNOSIS — K2210 Ulcer of esophagus without bleeding: Secondary | ICD-10-CM | POA: Diagnosis not present

## 2018-04-01 DIAGNOSIS — K2930 Chronic superficial gastritis without bleeding: Secondary | ICD-10-CM | POA: Diagnosis not present

## 2018-04-01 DIAGNOSIS — K449 Diaphragmatic hernia without obstruction or gangrene: Secondary | ICD-10-CM | POA: Diagnosis not present

## 2018-04-01 DIAGNOSIS — K210 Gastro-esophageal reflux disease with esophagitis: Secondary | ICD-10-CM | POA: Diagnosis not present

## 2018-04-07 DIAGNOSIS — K2930 Chronic superficial gastritis without bleeding: Secondary | ICD-10-CM | POA: Diagnosis not present

## 2018-04-07 DIAGNOSIS — K210 Gastro-esophageal reflux disease with esophagitis: Secondary | ICD-10-CM | POA: Diagnosis not present

## 2018-11-30 DIAGNOSIS — Z23 Encounter for immunization: Secondary | ICD-10-CM | POA: Diagnosis not present

## 2018-12-24 DIAGNOSIS — M25521 Pain in right elbow: Secondary | ICD-10-CM | POA: Diagnosis not present

## 2018-12-28 DIAGNOSIS — M25521 Pain in right elbow: Secondary | ICD-10-CM | POA: Diagnosis not present

## 2018-12-28 DIAGNOSIS — M7711 Lateral epicondylitis, right elbow: Secondary | ICD-10-CM | POA: Diagnosis not present

## 2019-03-08 DIAGNOSIS — Z0000 Encounter for general adult medical examination without abnormal findings: Secondary | ICD-10-CM | POA: Diagnosis not present

## 2019-03-23 DIAGNOSIS — Z131 Encounter for screening for diabetes mellitus: Secondary | ICD-10-CM | POA: Diagnosis not present

## 2019-03-23 DIAGNOSIS — Z13220 Encounter for screening for lipoid disorders: Secondary | ICD-10-CM | POA: Diagnosis not present

## 2019-03-23 DIAGNOSIS — Z1329 Encounter for screening for other suspected endocrine disorder: Secondary | ICD-10-CM | POA: Diagnosis not present

## 2019-03-23 DIAGNOSIS — Z0000 Encounter for general adult medical examination without abnormal findings: Secondary | ICD-10-CM | POA: Diagnosis not present

## 2019-03-25 ENCOUNTER — Ambulatory Visit: Payer: Self-pay | Attending: Internal Medicine

## 2019-03-25 DIAGNOSIS — Z20822 Contact with and (suspected) exposure to covid-19: Secondary | ICD-10-CM | POA: Insufficient documentation

## 2019-03-26 LAB — NOVEL CORONAVIRUS, NAA: SARS-CoV-2, NAA: NOT DETECTED

## 2019-03-28 ENCOUNTER — Other Ambulatory Visit: Payer: BLUE CROSS/BLUE SHIELD

## 2019-03-29 ENCOUNTER — Ambulatory Visit: Payer: Self-pay | Attending: Internal Medicine

## 2019-03-29 ENCOUNTER — Other Ambulatory Visit: Payer: Self-pay

## 2019-03-29 DIAGNOSIS — Z20822 Contact with and (suspected) exposure to covid-19: Secondary | ICD-10-CM | POA: Insufficient documentation

## 2019-03-30 LAB — NOVEL CORONAVIRUS, NAA: SARS-CoV-2, NAA: NOT DETECTED

## 2019-08-04 DIAGNOSIS — Z20822 Contact with and (suspected) exposure to covid-19: Secondary | ICD-10-CM | POA: Diagnosis not present

## 2019-10-22 DIAGNOSIS — Z20822 Contact with and (suspected) exposure to covid-19: Secondary | ICD-10-CM | POA: Diagnosis not present

## 2019-12-02 DIAGNOSIS — Z23 Encounter for immunization: Secondary | ICD-10-CM | POA: Diagnosis not present

## 2020-07-03 DIAGNOSIS — R059 Cough, unspecified: Secondary | ICD-10-CM | POA: Diagnosis not present

## 2020-07-03 DIAGNOSIS — Z20828 Contact with and (suspected) exposure to other viral communicable diseases: Secondary | ICD-10-CM | POA: Diagnosis not present

## 2020-07-03 DIAGNOSIS — Z20822 Contact with and (suspected) exposure to covid-19: Secondary | ICD-10-CM | POA: Diagnosis not present

## 2020-07-04 DIAGNOSIS — Z20828 Contact with and (suspected) exposure to other viral communicable diseases: Secondary | ICD-10-CM | POA: Diagnosis not present

## 2020-07-04 DIAGNOSIS — R059 Cough, unspecified: Secondary | ICD-10-CM | POA: Diagnosis not present

## 2020-07-07 ENCOUNTER — Emergency Department (HOSPITAL_BASED_OUTPATIENT_CLINIC_OR_DEPARTMENT_OTHER)
Admission: EM | Admit: 2020-07-07 | Discharge: 2020-07-07 | Disposition: A | Discharge status: Home / Self Care | Payer: BC Managed Care – PPO | Attending: Emergency Medicine | Admitting: Emergency Medicine

## 2020-07-07 ENCOUNTER — Encounter (HOSPITAL_BASED_OUTPATIENT_CLINIC_OR_DEPARTMENT_OTHER): Payer: Self-pay | Admitting: Emergency Medicine

## 2020-07-07 ENCOUNTER — Other Ambulatory Visit: Payer: Self-pay

## 2020-07-07 DIAGNOSIS — R42 Dizziness and giddiness: Secondary | ICD-10-CM | POA: Diagnosis not present

## 2020-07-07 DIAGNOSIS — R112 Nausea with vomiting, unspecified: Secondary | ICD-10-CM | POA: Diagnosis not present

## 2020-07-07 DIAGNOSIS — R030 Elevated blood-pressure reading, without diagnosis of hypertension: Secondary | ICD-10-CM

## 2020-07-07 DIAGNOSIS — R059 Cough, unspecified: Secondary | ICD-10-CM | POA: Diagnosis not present

## 2020-07-07 DIAGNOSIS — U071 COVID-19: Secondary | ICD-10-CM | POA: Diagnosis not present

## 2020-07-07 LAB — CBC
HCT: 42.4 % (ref 39.0–52.0)
Hemoglobin: 14.7 g/dL (ref 13.0–17.0)
MCH: 29.6 pg (ref 26.0–34.0)
MCHC: 34.7 g/dL (ref 30.0–36.0)
MCV: 85.3 fL (ref 80.0–100.0)
Platelets: 173 10*3/uL (ref 150–400)
RBC: 4.97 MIL/uL (ref 4.22–5.81)
RDW: 12.6 % (ref 11.5–15.5)
WBC: 6.4 10*3/uL (ref 4.0–10.5)
nRBC: 0 % (ref 0.0–0.2)

## 2020-07-07 LAB — COMPREHENSIVE METABOLIC PANEL
ALT: 27 U/L (ref 0–44)
AST: 26 U/L (ref 15–41)
Albumin: 4.3 g/dL (ref 3.5–5.0)
Alkaline Phosphatase: 77 U/L (ref 38–126)
Anion gap: 9 (ref 5–15)
BUN: 11 mg/dL (ref 6–20)
CO2: 25 mmol/L (ref 22–32)
Calcium: 8.5 mg/dL — ABNORMAL LOW (ref 8.9–10.3)
Chloride: 98 mmol/L (ref 98–111)
Creatinine, Ser: 0.72 mg/dL (ref 0.61–1.24)
GFR, Estimated: >60 mL/min (ref >60)
Glucose, Bld: 116 mg/dL — ABNORMAL HIGH (ref 70–99)
Potassium: 4.1 mmol/L (ref 3.5–5.1)
Sodium: 132 mmol/L — ABNORMAL LOW (ref 135–145)
Total Bilirubin: 0.4 mg/dL (ref 0.3–1.2)
Total Protein: 7.1 g/dL (ref 6.5–8.1)

## 2020-07-07 MED ORDER — ONDANSETRON 8 MG PO TBDP: 8.0000 mg | ORAL_TABLET | Freq: Three times a day (TID) | ORAL | 0 refills | Status: AC | PRN

## 2020-07-07 MED ORDER — LORAZEPAM 2 MG/ML IJ SOLN
1.0000 mg | Freq: Once | INTRAMUSCULAR | Status: AC
  Administered 2020-07-07: 1 mg via INTRAVENOUS
  Filled 2020-07-07: qty 1

## 2020-07-07 MED ORDER — METOCLOPRAMIDE HCL 5 MG/ML IJ SOLN
10.0000 mg | Freq: Once | INTRAMUSCULAR | Status: AC
  Administered 2020-07-07: 10 mg via INTRAVENOUS
  Filled 2020-07-07: qty 2

## 2020-07-07 MED ORDER — AMLODIPINE BESYLATE 5 MG PO TABS
5.0000 mg | ORAL_TABLET | Freq: Once | ORAL | Status: AC
  Administered 2020-07-07: 5 mg via ORAL
  Filled 2020-07-07: qty 1

## 2020-07-07 MED ORDER — SODIUM CHLORIDE 0.9 % IV BOLUS
1000.0000 mL | Freq: Once | INTRAVENOUS | Status: AC
  Administered 2020-07-07: 1000 mL via INTRAVENOUS

## 2020-07-07 MED ORDER — AMLODIPINE BESYLATE 5 MG PO TABS: 5.0000 mg | ORAL_TABLET | Freq: Every day | ORAL | 0 refills | Status: AC

## 2020-07-07 MED ORDER — ONDANSETRON HCL 4 MG/2ML IJ SOLN
4.0000 mg | Freq: Once | INTRAMUSCULAR | Status: AC
  Administered 2020-07-07: 4 mg via INTRAVENOUS
  Filled 2020-07-07: qty 2

## 2020-07-07 MED ORDER — ONDANSETRON 4 MG PO TBDP
8.0000 mg | ORAL_TABLET | Freq: Once | ORAL | Status: AC
  Administered 2020-07-07: 8 mg via ORAL
  Filled 2020-07-07: qty 2

## 2020-07-07 MED ORDER — SODIUM CHLORIDE 0.9 % IV BOLUS
500.0000 mL | Freq: Once | INTRAVENOUS | Status: AC
  Administered 2020-07-07: 500 mL via INTRAVENOUS

## 2020-07-07 NOTE — ED Triage Notes (Signed)
Pt had positive covid test May 10. Reports today he is dizzy, blood pressure staying in 180's. Felt like his heart was fluttering. V/d x1 today.

## 2020-07-07 NOTE — ED Provider Notes (Addendum)
[facility] EMERGENCY DEPT Provider Note   CSN: [id number] Arrival date & time: 07/07/20  1555     History Chief Complaint  Patient presents with  . Dizziness    Joud Ingwersen is a 50 y.o. male.  Patient states May 10 tested positive for covid, states initially was asymptomatic (tested as were cases in kids school), but then did develop non prod cough, general malaise, decreased appetite, mild aches. No high fever. No chest pain. No sob. States in past day episode of nausea/vomiting, and loose stool. Emesis not bloody or bilious. No abd pain. States also hx borderline htn and fam hx htn, and noted bp high today. No headache. No change in speech or vision, no numbness/weakness. No neck pain/stiffness.   The history is provided by the patient.  Dizziness Associated symptoms: nausea and vomiting   Associated symptoms: no chest pain, no headaches and no shortness of breath        Past Medical History:  Diagnosis Date  . Elevated cholesterol    MILD  . GERD (gastroesophageal reflux disease)   . Low testosterone level in male    resolved  . 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   Past Surgical History:  Procedure Laterality Date  . VASECTOMY  01/02/2012   Procedure: VASECTOMY;  Surgeon: Kathi Ludwig, MD;  [facility];  Service: Urology;  Laterality: Bilateral;  . WISDOM TOOTH EXTRACTION  AGE 55   ORAL SURGEON OFFICE (POST N/V)       No family history on file.  Social History   Tobacco Use  . Smoking status: Never Smoker  . Smokeless tobacco: Never Used  Substance Use Topics  . Alcohol use: Yes    Comment: OCCASIONAL  . Drug use: No    Home Medications Prior to Admission medications   Medication Sig Start Date End Date Taking? Authorizing Provider  acetaminophen (TYLENOL) 500 MG tablet Take 1,000 mg by mouth every 6 (six) hours as needed. For pain      [provider]  ALPRAZolam (XANAX) 0.25 MG tablet Take 0.25 mg by mouth as directed. As needed    [provider]  bismuth subsalicylate (PEPTO BISMOL) 262 MG chewable tablet as directed. Take 2 tablets by mouth as needed    [provider]  cetirizine (ZYRTEC) 10 MG tablet Take 10 mg by mouth daily as needed for allergies.    [provider]  ibuprofen (ADVIL,MOTRIN) 200 MG tablet Take 200 mg by mouth every 6 (six) hours as needed. For pain    [provider]  loperamide (IMODIUM) 2 MG capsule Take 2 mg by mouth as directed. As needed for diarrhea or loose stools    [provider]  meclizine (ANTIVERT) 25 MG tablet Take 1 tablet by mouth 2 (two) times daily as needed for dizziness. 01/14/16   [provider]  pantoprazole (PROTONIX) 20 MG tablet Take 20 mg by mouth daily. 03/10/16   [provider]    Allergies    Patient has no known allergies.  Review of Systems   Review of Systems  Constitutional: Negative for chills and fever.  HENT: Negative for sore throat.   Eyes: Negative for redness.  Respiratory: Positive for cough. Negative for shortness of breath.   Cardiovascular: Negative for chest pain and leg swelling.  Gastrointestinal: Positive for nausea and vomiting. Negative for abdominal pain.  Genitourinary: Negative for  dysuria and flank pain.  Musculoskeletal: Negative for back pain, neck pain and neck stiffness.  Skin: Negative for rash.  Neurological: Negative for headaches.  Hematological: Does not bruise/bleed easily.  Psychiatric/Behavioral: Negative for confusion.    Physical Exam Updated Vital Signs BP (!) 181/114 Comment: RN made aware; taken x2  Pulse 71   Temp 98.7 F (37.1 C) (Oral)   Resp 18   SpO2 100%   Physical Exam Vitals and nursing note reviewed.  Constitutional:      Appearance: Normal appearance. He is well-developed.  HENT:     Head: Atraumatic.     Nose: Nose normal.      Mouth/Throat:     Mouth: Mucous membranes are moist.     Pharynx: Oropharynx is clear. No oropharyngeal exudate or posterior oropharyngeal erythema.  Eyes:     General: No scleral icterus.    Conjunctiva/sclera: Conjunctivae normal.     Pupils: Pupils are equal, round, and reactive to light.  Neck:     Trachea: No tracheal deviation.  Cardiovascular:     Rate and Rhythm: Normal rate and regular rhythm.     Pulses: Normal pulses.     Heart sounds: Normal heart sounds. No murmur heard. No friction rub. No gallop.   Pulmonary:     Effort: Pulmonary effort is normal. No accessory muscle usage or respiratory distress.     Breath sounds: Normal breath sounds.  Abdominal:     General: There is no distension.     Palpations: Abdomen is soft.     Tenderness: There is no abdominal tenderness.  Genitourinary:    Comments: No cva tenderness. Musculoskeletal:        General: No swelling or tenderness.     Cervical back: Normal range of motion and neck supple. No rigidity.     Right lower leg: No edema.     Left lower leg: No edema.  Skin:    General: Skin is warm and dry.     Findings: No rash.  Neurological:     Mental Status: He is alert.     Comments: Alert, speech clear. Steady gait.   Psychiatric:        Mood and Affect: Mood normal.     ED Results / Procedures / Treatments   Labs (all labs ordered are listed, but only abnormal results are displayed) Results for orders placed or performed during the hospital encounter of 07/07/20  CBC  Result Value Ref Range   WBC 6.4 4.0 - 10.5 K/uL   RBC 4.97 4.22 - 5.81 MIL/uL   Hemoglobin 14.7 13.0 - 17.0 g/dL   HCT 11.9 41.7 - 40.8 %   MCV 85.3 80.0 - 100.0 fL   MCH 29.6 26.0 - 34.0 pg   MCHC 34.7 30.0 - 36.0 g/dL   RDW 14.4 81.8 - 56.3 %   Platelets 173 150 - 400 K/uL   nRBC 0.0 0.0 - 0.2 %  Comprehensive metabolic panel  Result Value Ref Range   Sodium 132 (L) 135 - 145 mmol/L   Potassium 4.1 3.5 - 5.1 mmol/L   Chloride 98  98 - 111 mmol/L   CO2 25 22 - 32 mmol/L   Glucose, Bld 116 (H) 70 - 99 mg/dL   BUN 11 6 - 20 mg/dL   Creatinine, Ser 1.49 0.61 - 1.24 mg/dL   Calcium 8.5 (L) 8.9 - 10.3 mg/dL   Total Protein 7.1 6.5 - 8.1 g/dL   Albumin 4.3 3.5 - 5.0  g/dL   AST 26 15 - 41 U/L   ALT 27 0 - 44 U/L   Alkaline Phosphatase 77 38 - 126 U/L   Total Bilirubin 0.4 0.3 - 1.2 mg/dL   GFR, Estimated >54 >00 mL/min   Anion gap 9 5 - 15    EKG None  Radiology No results found.  Procedures Procedures   Medications Ordered in ED Medications  ondansetron (ZOFRAN-ODT) disintegrating tablet 8 mg (has no administration in time range)    ED Course  I have reviewed the triage vital signs and the nursing notes.  Pertinent labs & imaging results that were available during my care of the patient were reviewed by me and considered in my medical decision making (see chart for details).    MDM Rules/Calculators/A&P                         Labs sent.  Reviewed nursing notes and prior charts for additional history.  Recent covid test positive.   Furious Chiarelli was evaluated in Emergency Department on 07/07/2020 for the symptoms described in the history of present illness. He was evaluated in the context of the global COVID-19 pandemic, which necessitated consideration that the patient might be at risk for infection with the SARS-CoV-2 virus that causes COVID-19. Institutional protocols and algorithms that pertain to the evaluation of patients at risk for COVID-19 are in a state of rapid change based on information released by regulatory bodies including the CDC and federal and state organizations. These policies and algorithms were followed during the patient's care in the ED.  zofran po, po fluids, food. Recheck bp.   Labs reviewed/interpreted by me - wbc normal. Renal fxn normal  On prior office visit, noted with mild elevated bp, and talk then of starting med if remained high.   Several reading high in ED, hx high  blood pressure (although not on meds for same), and fam hx htn - will start amlodipine - rec pcp f/u.  Pt is tolerating po. No nausea/vomiting.   Recheck, notes return of nausea. No abd pain. abd soft non tender. No headache. No cp. No sob. Pt does appear mildly anxious as well, hx same. reglan iv. Ivf. Po. Ativan 1 mg iv (has ride, spouse here, does not have to drive). In emesis bag, no emesis, few cc's saliva noted.      Final Clinical Impression(s) / ED Diagnoses Final diagnoses:  None    Rx / DC Orders ED Discharge Orders    None           Cathren Laine, MD 07/07/20 2037

## 2020-07-07 NOTE — Discharge Instructions (Addendum)
It was our pleasure to provide your ER care today - we hope that you feel better.  Drink plenty of fluids/stay well hydrated. Take zofran as need for nausea.  Your blood pressure is high tonight - limit salt intake, eat heart health meal plan, take blood pressure medication as prescribed, and follow up with your doctor in 1-2 weeks - call office Monday to arrange appointment.   Return to ER if worse, new symptoms, persistent vomiting, new or severe pain, severe abdominal pain, trouble breathing, or other concern.   You were given meds in ED that cause drowsiness - no driving for the next 6 hours.

## 2020-07-07 NOTE — ED Notes (Signed)
Per EDP order, pt given fluids and/or food for PO challenge. Pt verbalized understanding to utilize call bell if nausea or emesis occur. 

## 2020-07-12 DIAGNOSIS — I10 Essential (primary) hypertension: Secondary | ICD-10-CM | POA: Diagnosis not present

## 2020-07-12 DIAGNOSIS — U071 COVID-19: Secondary | ICD-10-CM | POA: Diagnosis not present

## 2020-10-15 DIAGNOSIS — F411 Generalized anxiety disorder: Secondary | ICD-10-CM | POA: Diagnosis not present

## 2020-10-15 DIAGNOSIS — Z0000 Encounter for general adult medical examination without abnormal findings: Secondary | ICD-10-CM | POA: Diagnosis not present

## 2020-10-15 DIAGNOSIS — K219 Gastro-esophageal reflux disease without esophagitis: Secondary | ICD-10-CM | POA: Diagnosis not present

## 2020-10-15 DIAGNOSIS — I10 Essential (primary) hypertension: Secondary | ICD-10-CM | POA: Diagnosis not present

## 2020-10-18 DIAGNOSIS — I10 Essential (primary) hypertension: Secondary | ICD-10-CM | POA: Diagnosis not present

## 2020-10-18 DIAGNOSIS — Z125 Encounter for screening for malignant neoplasm of prostate: Secondary | ICD-10-CM | POA: Diagnosis not present

## 2020-10-31 DIAGNOSIS — H5213 Myopia, bilateral: Secondary | ICD-10-CM | POA: Diagnosis not present

## 2020-10-31 DIAGNOSIS — H04123 Dry eye syndrome of bilateral lacrimal glands: Secondary | ICD-10-CM | POA: Diagnosis not present

## 2020-11-29 DIAGNOSIS — L308 Other specified dermatitis: Secondary | ICD-10-CM | POA: Diagnosis not present

## 2020-11-29 DIAGNOSIS — D2262 Melanocytic nevi of left upper limb, including shoulder: Secondary | ICD-10-CM | POA: Diagnosis not present

## 2020-11-29 DIAGNOSIS — D2261 Melanocytic nevi of right upper limb, including shoulder: Secondary | ICD-10-CM | POA: Diagnosis not present

## 2020-11-29 DIAGNOSIS — D2239 Melanocytic nevi of other parts of face: Secondary | ICD-10-CM | POA: Diagnosis not present

## 2020-12-06 DIAGNOSIS — Z23 Encounter for immunization: Secondary | ICD-10-CM | POA: Diagnosis not present

## 2020-12-13 DIAGNOSIS — R0789 Other chest pain: Secondary | ICD-10-CM | POA: Diagnosis not present

## 2021-01-13 DIAGNOSIS — R112 Nausea with vomiting, unspecified: Secondary | ICD-10-CM | POA: Diagnosis not present

## 2021-01-13 DIAGNOSIS — J101 Influenza due to other identified influenza virus with other respiratory manifestations: Secondary | ICD-10-CM | POA: Diagnosis not present

## 2021-01-13 DIAGNOSIS — Z20822 Contact with and (suspected) exposure to covid-19: Secondary | ICD-10-CM | POA: Diagnosis not present

## 2021-01-13 DIAGNOSIS — R509 Fever, unspecified: Secondary | ICD-10-CM | POA: Diagnosis not present

## 2021-04-16 DIAGNOSIS — Z1211 Encounter for screening for malignant neoplasm of colon: Secondary | ICD-10-CM | POA: Diagnosis not present

## 2021-04-16 DIAGNOSIS — D120 Benign neoplasm of cecum: Secondary | ICD-10-CM | POA: Diagnosis not present

## 2021-06-17 DIAGNOSIS — J029 Acute pharyngitis, unspecified: Secondary | ICD-10-CM | POA: Diagnosis not present

## 2021-06-17 DIAGNOSIS — K120 Recurrent oral aphthae: Secondary | ICD-10-CM | POA: Diagnosis not present

## 2022-01-13 DIAGNOSIS — Z0000 Encounter for general adult medical examination without abnormal findings: Secondary | ICD-10-CM | POA: Diagnosis not present

## 2022-01-13 DIAGNOSIS — I10 Essential (primary) hypertension: Secondary | ICD-10-CM | POA: Diagnosis not present

## 2022-01-13 DIAGNOSIS — F411 Generalized anxiety disorder: Secondary | ICD-10-CM | POA: Diagnosis not present

## 2022-01-13 DIAGNOSIS — K219 Gastro-esophageal reflux disease without esophagitis: Secondary | ICD-10-CM | POA: Diagnosis not present

## 2022-01-21 DIAGNOSIS — Z131 Encounter for screening for diabetes mellitus: Secondary | ICD-10-CM | POA: Diagnosis not present

## 2022-01-21 DIAGNOSIS — Z0000 Encounter for general adult medical examination without abnormal findings: Secondary | ICD-10-CM | POA: Diagnosis not present

## 2022-01-21 DIAGNOSIS — I10 Essential (primary) hypertension: Secondary | ICD-10-CM | POA: Diagnosis not present

## 2022-02-23 DIAGNOSIS — R051 Acute cough: Secondary | ICD-10-CM | POA: Diagnosis not present

## 2022-05-14 DIAGNOSIS — Z79899 Other long term (current) drug therapy: Secondary | ICD-10-CM | POA: Diagnosis not present

## 2022-06-02 DIAGNOSIS — G4719 Other hypersomnia: Secondary | ICD-10-CM | POA: Diagnosis not present

## 2022-06-02 DIAGNOSIS — F411 Generalized anxiety disorder: Secondary | ICD-10-CM | POA: Diagnosis not present

## 2022-06-02 DIAGNOSIS — I10 Essential (primary) hypertension: Secondary | ICD-10-CM | POA: Diagnosis not present

## 2022-06-06 DIAGNOSIS — J3081 Allergic rhinitis due to animal (cat) (dog) hair and dander: Secondary | ICD-10-CM | POA: Diagnosis not present

## 2022-06-06 DIAGNOSIS — J300 Vasomotor rhinitis: Secondary | ICD-10-CM | POA: Diagnosis not present

## 2022-06-06 DIAGNOSIS — J301 Allergic rhinitis due to pollen: Secondary | ICD-10-CM | POA: Diagnosis not present

## 2022-06-06 DIAGNOSIS — H1045 Other chronic allergic conjunctivitis: Secondary | ICD-10-CM | POA: Diagnosis not present

## 2022-06-25 DIAGNOSIS — T781XXA Other adverse food reactions, not elsewhere classified, initial encounter: Secondary | ICD-10-CM | POA: Diagnosis not present

## 2022-07-01 DIAGNOSIS — R200 Anesthesia of skin: Secondary | ICD-10-CM | POA: Diagnosis not present

## 2022-07-01 DIAGNOSIS — G4733 Obstructive sleep apnea (adult) (pediatric): Secondary | ICD-10-CM | POA: Diagnosis not present

## 2022-07-02 DIAGNOSIS — G4733 Obstructive sleep apnea (adult) (pediatric): Secondary | ICD-10-CM | POA: Diagnosis not present

## 2022-07-02 DIAGNOSIS — I10 Essential (primary) hypertension: Secondary | ICD-10-CM | POA: Diagnosis not present

## 2022-07-14 DIAGNOSIS — R200 Anesthesia of skin: Secondary | ICD-10-CM | POA: Diagnosis not present

## 2022-09-02 DIAGNOSIS — E7800 Pure hypercholesterolemia, unspecified: Secondary | ICD-10-CM | POA: Diagnosis not present

## 2022-09-15 ENCOUNTER — Other Ambulatory Visit (HOSPITAL_COMMUNITY): Payer: Self-pay | Admitting: Family Medicine

## 2022-09-15 DIAGNOSIS — E7800 Pure hypercholesterolemia, unspecified: Secondary | ICD-10-CM

## 2022-09-29 ENCOUNTER — Ambulatory Visit (HOSPITAL_COMMUNITY)
Admission: RE | Admit: 2022-09-29 | Discharge: 2022-09-29 | Disposition: A | Discharge status: Home / Self Care | Payer: BC Managed Care – PPO | Source: Ambulatory Visit | Attending: Family Medicine | Admitting: Family Medicine

## 2022-09-29 DIAGNOSIS — E7800 Pure hypercholesterolemia, unspecified: Secondary | ICD-10-CM | POA: Insufficient documentation

## 2022-11-21 DIAGNOSIS — R202 Paresthesia of skin: Secondary | ICD-10-CM | POA: Diagnosis not present

## 2022-11-24 ENCOUNTER — Other Ambulatory Visit: Payer: Self-pay | Admitting: Family Medicine

## 2022-11-24 DIAGNOSIS — R202 Paresthesia of skin: Secondary | ICD-10-CM

## 2022-11-30 ENCOUNTER — Ambulatory Visit
Admission: RE | Admit: 2022-11-30 | Discharge: 2022-11-30 | Disposition: A | Discharge status: Home / Self Care | Payer: BC Managed Care – PPO | Source: Ambulatory Visit | Attending: Family Medicine | Admitting: Family Medicine

## 2022-11-30 DIAGNOSIS — R202 Paresthesia of skin: Secondary | ICD-10-CM

## 2022-11-30 MED ORDER — GADOPICLENOL 0.5 MMOL/ML IV SOLN
9.0000 mL | Freq: Once | INTRAVENOUS | Status: AC | PRN
  Administered 2022-11-30: 9 mL via INTRAVENOUS

## 2022-12-01 DIAGNOSIS — I10 Essential (primary) hypertension: Secondary | ICD-10-CM | POA: Diagnosis not present

## 2022-12-01 DIAGNOSIS — G4733 Obstructive sleep apnea (adult) (pediatric): Secondary | ICD-10-CM | POA: Diagnosis not present

## 2022-12-04 ENCOUNTER — Encounter: Payer: Self-pay | Admitting: Neurology

## 2022-12-19 ENCOUNTER — Other Ambulatory Visit: Payer: Self-pay | Admitting: Medical Genetics

## 2022-12-19 DIAGNOSIS — Z006 Encounter for examination for normal comparison and control in clinical research program: Secondary | ICD-10-CM

## 2022-12-29 NOTE — Progress Notes (Deleted)
Initial neurology clinic note  Reason for Evaluation: Consultation requested by Darrow Bussing, MD for an opinion regarding ***. My final recommendations will be communicated back to the requesting physician by way of shared medical record or letter to requesting physician via [place] mail.  HPI: This is [name], a 52 y.o. ***-handed male with a medical history of HTN, OSA (on CPAP), anxiety, GERD*** who presents to neurology clinic with the chief complaint of ***. The patient is accompanied by ***.  *** Numbness and tingling of right leg and foot  ~ 9 years Intermittent Numbness in other places? Back, right side  Partially empty sella on MRI brain, otherwise unremarkable Headaches?  On citalopram, xanax, hydrochlorothiazide, Norvasc, zyrtec, pantoprazole  The patient has not*** had similar episodes of symptoms in the past. ***  Muscle bulk loss? *** Muscle pain? ***  Cramps/Twitching? *** Suggestion of myotonia/difficulty relaxing after contraction? ***  Fatigable weakness?*** Does strength improve after brief exercise?***  Able to brush hair/teeth without difficulty? *** Able to button shirts/use zips? *** Clumsiness/dropping grasped objects?*** Can you arise from squatted position easily? *** Able to get out of chair without using arms? *** Able to walk up steps easily? *** Use an assistive device to walk? *** Significant imbalance with walking? *** Falls?*** Any change in urine color, especially after exertion/physical activity? ***  The patient denies*** symptoms suggestive of oculobulbar weakness including diplopia, ptosis, dysphagia, poor saliva control, dysarthria/dysphonia, impaired mastication, facial weakness/droop.  There are no*** neuromuscular respiratory weakness symptoms, particularly orthopnea>dyspnea.   Pseudobulbar affect is absent***.  The patient does not*** report symptoms referable to autonomic dysfunction including impaired sweating, heat or cold  intolerance, excessive mucosal dryness, gastroparetic early satiety, postprandial abdominal bloating, constipation, bowel or bladder dyscontrol, erectile dysfunction*** or syncope/presyncope/orthostatic intolerance.  There are no*** complaints relating to other symptoms of small fiber modalities including paresthesia/pain.  The patient has not *** noticed any recent skin rashes nor does he*** report any constitutional symptoms like fever, night sweats, anorexia or unintentional weight loss.  EtOH use: ***  Restrictive diet? *** Family history of neuropathy/myopathy/NM disease?***  Previous labs, electrodiagnostics, and neuroimaging are summarized below, but pertinent findings include***  Any biopsy done? *** Current medications being tried for the patient's symptoms include ***  Prior medications that have been tried: ***   MEDICATIONS:  Outpatient Encounter Medications as of 01/07/2023  Medication Sig   acetaminophen (TYLENOL) 500 MG tablet Take 1,000 mg by mouth every 6 (six) hours as needed. For pain    ALPRAZolam (XANAX) 0.25 MG tablet Take 0.25 mg by mouth as directed. As needed   amLODipine (NORVASC) 5 MG tablet Take 1 tablet (5 mg total) by mouth daily.   bismuth subsalicylate (PEPTO BISMOL) 262 MG chewable tablet as directed. Take 2 tablets by mouth as needed   cetirizine (ZYRTEC) 10 MG tablet Take 10 mg by mouth daily as needed for allergies.   ibuprofen (ADVIL,MOTRIN) 200 MG tablet Take 200 mg by mouth every 6 (six) hours as needed. For pain   loperamide (IMODIUM) 2 MG capsule Take 2 mg by mouth as directed. As needed for diarrhea or loose stools   meclizine (ANTIVERT) 25 MG tablet Take 1 tablet by mouth 2 (two) times daily as needed for dizziness.   ondansetron (ZOFRAN ODT) 8 MG disintegrating tablet Take 1 tablet (8 mg total) by mouth every 8 (eight) hours as needed for nausea or vomiting.   pantoprazole (PROTONIX) 20 MG tablet Take 20 mg by mouth daily.  No  facility-administered encounter medications on file as of 01/07/2023.    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  SOCIAL HISTORY: Social History   Tobacco Use   Smoking status: Never   Smokeless tobacco: Never  Substance Use Topics   Alcohol use: Yes    Comment: OCCASIONAL   Drug use: No   Social History   Social History Narrative   Not on file     OBJECTIVE: PHYSICAL EXAM: There were no vitals taken for this visit.  General:*** General appearance: Awake and alert. No distress. Cooperative with exam.  Skin: No obvious rash or jaundice. HEENT: Atraumatic. Anicteric. Lungs: Non-labored breathing on room air  Heart: Regular Abdomen: Soft, non tender. Extremities: No edema. No obvious deformity.  Musculoskeletal: No obvious joint swelling. Psych: Affect appropriate.  Neurological: Mental Status: Alert. Speech fluent. No pseudobulbar affect Cranial Nerves: CNII: No RAPD. Visual fields grossly intact. CNIII, IV, VI: PERRL. No nystagmus. EOMI. CN V: Facial sensation intact bilaterally to fine touch. [place] clench strong. Jaw jerk***. CN VII: Facial muscles symmetric and strong. No ptosis at rest or after sustained upgaze***. CN VIII: Hearing grossly intact bilaterally. CN IX: No hypophonia. CN X: Palate elevates symmetrically. CN XI: Full strength shoulder shrug bilaterally. CN XII: Tongue  protrusion full and midline. No atrophy or fasciculations. No significant dysarthria*** Motor: Tone is ***. *** fasciculations in *** extremities. *** atrophy. No grip or percussive myotonia.***  Individual muscle group testing (MRC grade out of 5):  Movement     Neck flexion ***    Neck extension ***     Right Left   Shoulder abduction *** ***   Shoulder adduction *** ***   Shoulder ext rotation *** ***   Shoulder int rotation *** ***   Elbow flexion *** ***   Elbow extension *** ***   Wrist extension *** ***   Wrist flexion *** ***   Finger abduction - FDI *** ***   Finger abduction - ADM *** ***   Finger extension *** ***   Finger distal flexion - 2/3 *** ***   Finger distal flexion - 4/5 *** ***   Thumb flexion - FPL *** ***   Thumb abduction - APB *** ***    Hip flexion *** ***   Hip extension *** ***   Hip adduction *** ***   Hip abduction *** ***   Knee extension *** ***   Knee flexion *** ***   Dorsiflexion *** ***   Plantarflexion *** ***   Inversion *** ***   Eversion *** ***   Great toe extension *** ***   Great toe flexion *** ***     Reflexes:  Right Left   Bicep *** ***   Tricep *** ***   BrRad *** ***   Knee *** ***   Ankle *** ***    Pathological Reflexes: Babinski: ***  response bilaterally*** Hoffman: *** Troemner: *** Pectoral: *** Palmomental: *** Facial: *** Midline tap: *** Sensation: Pinprick: *** Vibration: *** Temperature: *** Proprioception: *** Coordination: Intact finger-to- nose-finger bilaterally. Romberg negative.*** Gait: Able to rise from chair with arms crossed unassisted. Normal, narrow-based gait. Able to tandem walk. Able to walk on toes and heels.***  Lab and Test Review: External labs: 11/26/22: CMP unremarkable CBC unremarkable B12: 612  TSH (04/16/15) wnl  Imaging: MRI brain w/wo contrast (11/30/22): FINDINGS: Brain: No acute infarction, hemorrhage, hydrocephalus, extra-axial collection or mass  lesion. No pathologic enhancement. Partially empty sella.   Vascular: Major arterial flow voids are maintained skull base.   Skull and upper cervical spine: Normal marrow signal.   Sinuses/Orbits: Mostly clear sinuses.  No acute orbital findings.   Other: No mastoid effusions.   IMPRESSION: 1. No evidence of acute intracranial abnormality. 2. Partially empty sella, which is often a normal anatomic variant but can be associated with idiopathic intracranial hypertension. ***  ASSESSMENT: Frederick Herrera is a 52 y.o. male who presents for evaluation of ***. *** has a relevant medical history of ***. *** neurological examination is pertinent for ***. Available diagnostic data is significant for ***. This constellation of symptoms and objective data would most likely localize to ***. ***  PLAN: -Blood work: *** ***  -Return to clinic ***  The impression above as well as the plan as outlined below were extensively discussed with the patient (in the company of ***) who voiced understanding. All questions were answered to their satisfaction.  The patient was counseled on pertinent fall precautions per the printed material provided today, and as noted under the "Patient Instructions" section below.***  When available, results of the above investigations and possible further recommendations will be communicated to the patient via telephone/MyChart. Patient to call office if not contacted after expected testing turnaround time.   Total time spent reviewing records, interview, history/exam, documentation, and coordination of care on day of encounter:  *** min   Thank you for allowing me to participate in patient's care.  If I can answer any additional questions, I would be pleased to do so.  Jacquelyne Balint, MD   CC: Darrow Bussing, MD [address] [place] [address]  CC: Referring provider: Darrow Bussing, MD [address] [place],  [place] [address]

## 2022-12-31 NOTE — Progress Notes (Signed)
Initial neurology clinic note  Reason for Evaluation: Consultation requested by Darrow Bussing, MD for an opinion regarding paresthesias. My final recommendations will be communicated back to the requesting physician by way of shared medical record or letter to requesting physician via [place] mail.  HPI: This is Frederick Herrera, a 52 y.o. right-handed male with a medical history of HTN, OSA (on CPAP), anxiety, GERD who presents to neurology clinic with the chief complaint of paresthesias. The patient is alone today.  Patient was having numbness and tingling in his right leg in the spring of this year (2024). He was driving on a family trip. He was the driver for a long time. He points at the front of the lower leg and the lateral aspect of his right foot. It was very uncomfortable. It lasted about 2 months then resolved. Then over the summer of 2024, he started having tingling in other places. If he takes a shower, he will have tingling mostly in his right hand and arm and back. He sometimes has symptoms on the left too. He will go on walks with his wife and have tingling in both legs. He takes warm (not too hot) shower and this seems to trigger symptoms. He may have increased symptoms in temperature changes and also with increased stress. He does not have definite weakness.   Patient has seen by PCP and had MRI of brain. It showed a partially empty sella, but otherwise unremarkable. His wife and sister both have MS, so this was a concern for the patient.  He denies significant symptoms prior to spring of 2024. He denies vision loss or eye pain.   He denies headaches.  Muscle bulk loss? No Muscle pain? No  Cramps/Twitching? No Significant imbalance with walking? No Falls? No Any change in urine color, especially after exertion/physical activity? No  The patient denies symptoms suggestive of oculobulbar weakness including diplopia, ptosis, dysphagia, poor saliva control, dysarthria/dysphonia,  impaired mastication, facial weakness/droop.  There are no neuromuscular respiratory weakness symptoms, particularly orthopnea>dyspnea.   The patient does not report symptoms referable to autonomic dysfunction including impaired sweating, heat or cold intolerance, excessive mucosal dryness, gastroparetic early satiety, postprandial abdominal bloating, constipation, bowel or bladder dyscontrol, erectile dysfunction, or syncope/presyncope/orthostatic intolerance.  The patient has not noticed any recent skin rashes nor does he report any constitutional symptoms like fever, night sweats, anorexia or unintentional weight loss. He has had recent weight gain, but per patient his wife thinks this is because he is snacking more.  Patient takes a multivitamin, but no B complex.  EtOH use: couple beers a month  Restrictive diet? No Family history of neuropathy/myopathy/neurologic disease? Sister with MS, mother has problems, but does not talk about her medical issues   MEDICATIONS:  Outpatient Encounter Medications as of 01/02/2023  Medication Sig   acetaminophen (TYLENOL) 500 MG tablet Take 1,000 mg by mouth every 6 (six) hours as needed. For pain    ALPRAZolam (XANAX) 0.25 MG tablet Take 0.25 mg by mouth as needed for anxiety. As needed   amLODipine (NORVASC) 5 MG tablet Take 1 tablet (5 mg total) by mouth daily.   bismuth subsalicylate (PEPTO BISMOL) 262 MG chewable tablet as directed. Take 2 tablets by mouth as needed   cetirizine (ZYRTEC) 10 MG tablet Take 10 mg by mouth daily as needed for allergies.   ibuprofen (ADVIL,MOTRIN) 200 MG tablet Take 200 mg by mouth every 6 (six) hours as needed. For pain   loperamide (IMODIUM) 2 MG  capsule Take 2 mg by mouth as directed. As needed for diarrhea or loose stools   ondansetron (ZOFRAN ODT) 8 MG disintegrating tablet Take 1 tablet (8 mg total) by mouth every 8 (eight) hours as needed for nausea or vomiting.   pantoprazole (PROTONIX) 20 MG tablet Take 20  mg by mouth daily.   meclizine (ANTIVERT) 25 MG tablet Take 1 tablet by mouth 2 (two) times daily as needed for dizziness. (Patient not taking: Reported on 01/02/2023)   No facility-administered encounter medications on file as of 01/02/2023.    PAST MEDICAL HISTORY: Past Medical History:  Diagnosis Date   Elevated cholesterol    MILD   GERD (gastroesophageal reflux disease)    Low testosterone level in male    resolved   Ulcer aphthous oral CANKER ULCER IN THROAT    PAST SURGICAL HISTORY: Past Surgical History:  Procedure Laterality Date   VASECTOMY  01/02/2012   Procedure: VASECTOMY;  Surgeon: Kathi Ludwig, MD;  Location: [facility];  Service: Urology;  Laterality: Bilateral;   WISDOM TOOTH EXTRACTION  AGE 46   ORAL SURGEON OFFICE (POST N/V)    ALLERGIES: No Known Allergies  FAMILY HISTORY: Family History  Problem Relation Age of Onset   Mental illness Mother    Cancer Father     SOCIAL HISTORY: Social History   Tobacco Use   Smoking status: Never   Smokeless tobacco: Current  Vaping Use   Vaping status: Never Used  Substance Use Topics   Alcohol use: Yes    Comment: OCCASIONAL   Drug use: No   Social History   Social History Narrative   Are you right handed or left handed? Right   Are you currently employed ?    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   Who lives with you?  family   What type of home do you live in: 1 story or 2 story? one    Caffeine 3 or  4 cups daily     OBJECTIVE: PHYSICAL EXAM: BP 129/86   Pulse 67   Ht 5\' 11"  (1.803 m)   Wt 192 lb (87.1 kg)   SpO2 98%   BMI 26.78 kg/m   General: General appearance: Awake and alert. No distress. Cooperative with exam.  Skin: No obvious rash or jaundice. HEENT: Atraumatic. Anicteric. Fundoscopy: Lateral disc margins clear, not as well seen nasally. No obvious papilledema. Lungs: Non-labored breathing on room air  Extremities: No  edema. No obvious deformity.  Psych: Affect appropriate.  Neurological: Mental Status: Alert. Speech fluent. No pseudobulbar affect Cranial Nerves: CNII: No RAPD. Visual fields grossly intact. CNIII, IV, VI: PERRL. No nystagmus. EOMI. CN V: Facial sensation intact bilaterally to fine touch. CN VII: Facial muscles symmetric and strong. No ptosis at rest. CN VIII: Hearing grossly intact bilaterally. CN IX: No hypophonia. CN X: Palate elevates symmetrically. CN XI: Full strength shoulder shrug bilaterally. CN XII: Tongue protrusion full and midline. No atrophy or fasciculations. No significant dysarthria Motor: Tone is normal. No fasciculations in extremities. No atrophy. Strength is 5/5 in bilateral upper and lower extremities Reflexes:  Right Left   Bicep 2+ 2+   Tricep 2+ 2+   BrRad 2+ 2+   Knee 2+ 2+   Ankle 2+ 2+    Pathological Reflexes: Babinski: flexor response bilaterally Hoffman: absent bilaterally Troemner: absent bilaterally Sensation: Tinel's test negative bilaterally at carpal tunnel, elbow, and fibular head Pinprick: Intact in  all extremities Vibration: Intact in all extremities Proprioception: Intact in bilateral great toes Coordination: Intact finger-to- nose-finger bilaterally. Romberg negative. Gait: Able to rise from chair with arms crossed unassisted. Normal, narrow-based gait. Able to tandem walk. Able to walk on toes and heels.  Lab and Test Review: External labs: 11/26/22: CMP unremarkable CBC unremarkable B12: 612  TSH (04/16/15) wnl  Imaging: MRI brain w/wo contrast (11/30/22): FINDINGS: Brain: No acute infarction, hemorrhage, hydrocephalus, extra-axial collection or mass lesion. No pathologic enhancement. Partially empty sella.   Vascular: Major arterial flow voids are maintained skull base.   Skull and upper cervical spine: Normal marrow signal.   Sinuses/Orbits: Mostly clear sinuses.  No acute orbital findings.   Other: No mastoid  effusions.   IMPRESSION: 1. No evidence of acute intracranial abnormality. 2. Partially empty sella, which is often a normal anatomic variant but can be associated with idiopathic intracranial hypertension.  ASSESSMENT: Ziere Kuti is a 52 y.o. male who presents for evaluation of paresthesias in bilateral arms (right > left) and legs. He has a relevant medical history of HTN, OSA (on CPAP), anxiety, GERD. His neurological examination is essentially normal today. Available diagnostic data is significant for MRI brain showing no definitive abnormalities thought he does have a partially empty sella, which can be a normal variant. The etiology of patient's symptoms is not clear. He describes an uhthoff phenomenon (worsening of symptoms with warmer temperature) as can be seen in demyelinating diseases such as MS. His MRI brain does not show any evidence of this though. It is reasonable to look at the cervical spine to ensure there is no evidence of demyelinating disease. I will send labs to look for treatable causes as well. I may consider EMG to evaluate for multiple mononeuropathies if this work up is unremarkable and his symptoms persist.  PLAN: -Blood work: B1, B6, ANA, ENA -MRI cervical spine w/wo contrast -May consider EMG if above testing is unremarkable  -Return to clinic to be determined  The impression above as well as the plan as outlined below were extensively discussed with the patient who voiced understanding. All questions were answered to their satisfaction.  When available, results of the above investigations and possible further recommendations will be communicated to the patient via telephone/MyChart. Patient to call office if not contacted after expected testing turnaround time.   Total time spent reviewing records, interview, history/exam, documentation, and coordination of care on day of encounter:  65 min   Thank you for allowing me to participate in patient's care.  If I can  answer any additional questions, I would be pleased to do so.  Jacquelyne Balint, MD   CC: [place] [address] [place] [address]  CC: Referring provider: Darrow Bussing, MD [address] [place],  [place] [address]

## 2023-01-02 ENCOUNTER — Encounter: Payer: Self-pay | Admitting: Neurology

## 2023-01-02 ENCOUNTER — Ambulatory Visit (INDEPENDENT_AMBULATORY_CARE_PROVIDER_SITE_OTHER): Payer: BC Managed Care – PPO | Admitting: Neurology

## 2023-01-02 ENCOUNTER — Other Ambulatory Visit (INDEPENDENT_AMBULATORY_CARE_PROVIDER_SITE_OTHER): Payer: BC Managed Care – PPO

## 2023-01-02 VITALS — BP 129/86 | HR 67 | Ht 71.0 in | Wt 192.0 lb

## 2023-01-02 DIAGNOSIS — R200 Anesthesia of skin: Secondary | ICD-10-CM

## 2023-01-02 DIAGNOSIS — R202 Paresthesia of skin: Secondary | ICD-10-CM

## 2023-01-02 NOTE — Patient Instructions (Signed)
I saw you today for numbness and tingling in your arms and legs (right more than left). I am not sure the cause of your symptoms. Your neurologic examination and MRI brain are reassuring, but I would like to evaluate further with the following: -Blood work today -MRI of your cervical spine (neck). We will put in this order today. You will be contacted to schedule this. If you do not hear from anyone in 1-2 weeks, please let us know.  I will be in touch when I have those results. We will discuss if other testing is needed at that time.  Please let me know if you have any questions or concerns in the meantime.  The physicians and staff at [facility] are committed to providing excellent care. You may receive a survey requesting feedback about your experience at our office. We strive to receive "very good" responses to the survey questions. If you feel that your experience would prevent you from giving the office a "very good " response, please contact our office to try to remedy the situation. We may be reached at [phone]. Thank you for taking the time out of your busy day to complete the survey.  Jacquelyne Balint, MD [facility]

## 2023-01-06 LAB — ANA+ENA+DNA/DS+SCL 70+SJOSSA/B
ANA Titer 1: NEGATIVE
ENA RNP Ab: 0.3 AI (ref 0.0–0.9)
ENA SM Ab Ser-aCnc: <0.2 AI (ref 0.0–0.9)
ENA SSA (RO) Ab: 0.2 AI (ref 0.0–0.9)
ENA SSB (LA) Ab: <0.2 AI (ref 0.0–0.9)
Scleroderma (Scl-70) (ENA) Antibody, IgG: <0.2 AI (ref 0.0–0.9)
dsDNA Ab: 1 [IU]/mL (ref 0–9)

## 2023-01-06 LAB — VITAMIN B6: Vitamin B6: 85.5 ng/mL — ABNORMAL HIGH (ref 2.1–21.7)

## 2023-01-06 LAB — VITAMIN B1: Vitamin B1 (Thiamine): 23 nmol/L (ref 8–30)

## 2023-01-07 ENCOUNTER — Encounter: Payer: Self-pay | Admitting: Neurology

## 2023-01-07 ENCOUNTER — Ambulatory Visit: Payer: BC Managed Care – PPO | Admitting: Neurology

## 2023-01-14 ENCOUNTER — Other Ambulatory Visit (HOSPITAL_COMMUNITY): Payer: Self-pay

## 2023-02-10 ENCOUNTER — Encounter: Payer: Self-pay | Admitting: Neurology

## 2023-02-14 ENCOUNTER — Other Ambulatory Visit: Payer: BC Managed Care – PPO

## 2023-02-20 ENCOUNTER — Ambulatory Visit
Admission: RE | Admit: 2023-02-20 | Discharge: 2023-02-20 | Disposition: A | Discharge status: Home / Self Care | Payer: BC Managed Care – PPO | Source: Ambulatory Visit | Attending: Neurology

## 2023-02-20 DIAGNOSIS — R200 Anesthesia of skin: Secondary | ICD-10-CM

## 2023-02-20 DIAGNOSIS — M5023 Other cervical disc displacement, cervicothoracic region: Secondary | ICD-10-CM | POA: Diagnosis not present

## 2023-02-20 DIAGNOSIS — R202 Paresthesia of skin: Secondary | ICD-10-CM

## 2023-02-20 MED ORDER — GADOPICLENOL 0.5 MMOL/ML IV SOLN
9.0000 mL | Freq: Once | INTRAVENOUS | Status: AC | PRN
  Administered 2023-02-20: 9 mL via INTRAVENOUS

## 2023-03-05 ENCOUNTER — Encounter: Payer: Self-pay | Admitting: Neurology

## 2023-04-09 DIAGNOSIS — D2261 Melanocytic nevi of right upper limb, including shoulder: Secondary | ICD-10-CM | POA: Diagnosis not present

## 2023-04-09 DIAGNOSIS — D2239 Melanocytic nevi of other parts of face: Secondary | ICD-10-CM | POA: Diagnosis not present

## 2023-04-09 DIAGNOSIS — D2271 Melanocytic nevi of right lower limb, including hip: Secondary | ICD-10-CM | POA: Diagnosis not present

## 2023-04-09 DIAGNOSIS — D485 Neoplasm of uncertain behavior of skin: Secondary | ICD-10-CM | POA: Diagnosis not present

## 2023-04-09 DIAGNOSIS — D2262 Melanocytic nevi of left upper limb, including shoulder: Secondary | ICD-10-CM | POA: Diagnosis not present

## 2023-04-09 DIAGNOSIS — D225 Melanocytic nevi of trunk: Secondary | ICD-10-CM | POA: Diagnosis not present

## 2023-07-01 DIAGNOSIS — H5213 Myopia, bilateral: Secondary | ICD-10-CM | POA: Diagnosis not present

## 2023-07-01 DIAGNOSIS — H52221 Regular astigmatism, right eye: Secondary | ICD-10-CM | POA: Diagnosis not present

## 2023-07-01 DIAGNOSIS — H524 Presbyopia: Secondary | ICD-10-CM | POA: Diagnosis not present

## 2023-10-12 DIAGNOSIS — Z0000 Encounter for general adult medical examination without abnormal findings: Secondary | ICD-10-CM | POA: Diagnosis not present

## 2023-10-12 DIAGNOSIS — I10 Essential (primary) hypertension: Secondary | ICD-10-CM | POA: Diagnosis not present

## 2023-10-12 DIAGNOSIS — R002 Palpitations: Secondary | ICD-10-CM | POA: Diagnosis not present

## 2023-10-12 DIAGNOSIS — F411 Generalized anxiety disorder: Secondary | ICD-10-CM | POA: Diagnosis not present

## 2023-10-12 DIAGNOSIS — K219 Gastro-esophageal reflux disease without esophagitis: Secondary | ICD-10-CM | POA: Diagnosis not present

## 2023-10-29 DIAGNOSIS — Z0000 Encounter for general adult medical examination without abnormal findings: Secondary | ICD-10-CM | POA: Diagnosis not present

## 2023-11-25 DIAGNOSIS — R7309 Other abnormal glucose: Secondary | ICD-10-CM | POA: Diagnosis not present

## 2023-12-15 ENCOUNTER — Other Ambulatory Visit: Payer: Self-pay | Admitting: Medical Genetics

## 2023-12-15 DIAGNOSIS — Z006 Encounter for examination for normal comparison and control in clinical research program: Secondary | ICD-10-CM

## 2023-12-26 DIAGNOSIS — R7309 Other abnormal glucose: Secondary | ICD-10-CM | POA: Diagnosis not present
